# Patient Record
Sex: Female | Born: 1955 | Race: White | Hispanic: No | Marital: Married | Smoking: Former smoker
Health system: Southern US, Community
[De-identification: ages and names within clinical notes are randomized; demographics above are authoritative.]

## PROBLEM LIST (undated history)

## (undated) DIAGNOSIS — W3400XA Accidental discharge from unspecified firearms or gun, initial encounter: Secondary | ICD-10-CM

## (undated) DIAGNOSIS — F419 Anxiety disorder, unspecified: Secondary | ICD-10-CM

## (undated) DIAGNOSIS — S72001A Fracture of unspecified part of neck of right femur, initial encounter for closed fracture: Secondary | ICD-10-CM

## (undated) DIAGNOSIS — F329 Major depressive disorder, single episode, unspecified: Secondary | ICD-10-CM

## (undated) DIAGNOSIS — J45909 Unspecified asthma, uncomplicated: Secondary | ICD-10-CM

## (undated) DIAGNOSIS — J449 Chronic obstructive pulmonary disease, unspecified: Secondary | ICD-10-CM

## (undated) DIAGNOSIS — F32A Depression, unspecified: Secondary | ICD-10-CM

## (undated) HISTORY — PX: ABDOMINAL HYSTERECTOMY: SHX81

---

## 2005-05-28 ENCOUNTER — Emergency Department (HOSPITAL_COMMUNITY): Admission: EM | Admit: 2005-05-28 | Discharge: 2005-05-28 | Payer: Self-pay | Admitting: Emergency Medicine

## 2005-10-03 ENCOUNTER — Emergency Department (HOSPITAL_COMMUNITY): Admission: EM | Admit: 2005-10-03 | Discharge: 2005-10-03 | Payer: Self-pay | Admitting: Emergency Medicine

## 2005-10-20 ENCOUNTER — Encounter (HOSPITAL_COMMUNITY): Admission: RE | Admit: 2005-10-20 | Discharge: 2005-11-19 | Payer: Self-pay

## 2005-10-24 ENCOUNTER — Ambulatory Visit: Payer: Self-pay | Admitting: Family Medicine

## 2005-11-07 ENCOUNTER — Ambulatory Visit: Payer: Self-pay | Admitting: Family Medicine

## 2005-11-08 ENCOUNTER — Encounter (INDEPENDENT_AMBULATORY_CARE_PROVIDER_SITE_OTHER): Payer: Self-pay | Admitting: Family Medicine

## 2005-11-16 ENCOUNTER — Ambulatory Visit: Payer: Self-pay | Admitting: Internal Medicine

## 2005-11-16 ENCOUNTER — Ambulatory Visit (HOSPITAL_COMMUNITY): Admission: RE | Admit: 2005-11-16 | Discharge: 2005-11-16 | Payer: Self-pay | Admitting: Internal Medicine

## 2005-11-18 ENCOUNTER — Ambulatory Visit: Payer: Self-pay | Admitting: Family Medicine

## 2005-11-18 ENCOUNTER — Ambulatory Visit (HOSPITAL_COMMUNITY): Admission: RE | Admit: 2005-11-18 | Discharge: 2005-11-18 | Payer: Self-pay | Admitting: Family Medicine

## 2005-11-21 ENCOUNTER — Ambulatory Visit: Payer: Self-pay | Admitting: Family Medicine

## 2005-11-25 ENCOUNTER — Ambulatory Visit: Payer: Self-pay | Admitting: Family Medicine

## 2005-12-02 ENCOUNTER — Ambulatory Visit: Payer: Self-pay | Admitting: Family Medicine

## 2005-12-02 LAB — CONVERTED CEMR LAB
RBC count: 4.57 10*6/uL
WBC, blood: 15.6 10*3/uL

## 2005-12-05 ENCOUNTER — Ambulatory Visit: Payer: Self-pay | Admitting: Family Medicine

## 2005-12-07 ENCOUNTER — Ambulatory Visit: Payer: Self-pay | Admitting: Family Medicine

## 2006-01-05 ENCOUNTER — Ambulatory Visit: Payer: Self-pay | Admitting: Family Medicine

## 2006-01-30 ENCOUNTER — Ambulatory Visit: Payer: Self-pay | Admitting: Family Medicine

## 2006-02-02 ENCOUNTER — Ambulatory Visit: Payer: Self-pay | Admitting: Family Medicine

## 2006-02-03 ENCOUNTER — Encounter (INDEPENDENT_AMBULATORY_CARE_PROVIDER_SITE_OTHER): Payer: Self-pay | Admitting: Family Medicine

## 2006-02-03 ENCOUNTER — Ambulatory Visit (HOSPITAL_COMMUNITY): Admission: RE | Admit: 2006-02-03 | Discharge: 2006-02-03 | Payer: Self-pay | Admitting: Family Medicine

## 2006-02-08 ENCOUNTER — Ambulatory Visit (HOSPITAL_COMMUNITY): Admission: RE | Admit: 2006-02-08 | Discharge: 2006-02-08 | Payer: Self-pay | Admitting: Family Medicine

## 2006-02-16 ENCOUNTER — Ambulatory Visit: Payer: Self-pay | Admitting: Family Medicine

## 2006-03-07 ENCOUNTER — Ambulatory Visit: Payer: Self-pay | Admitting: Family Medicine

## 2006-03-09 ENCOUNTER — Encounter (INDEPENDENT_AMBULATORY_CARE_PROVIDER_SITE_OTHER): Payer: Self-pay | Admitting: Family Medicine

## 2006-03-09 ENCOUNTER — Ambulatory Visit (HOSPITAL_COMMUNITY): Admission: RE | Admit: 2006-03-09 | Discharge: 2006-03-09 | Payer: Self-pay | Admitting: Family Medicine

## 2006-03-16 ENCOUNTER — Encounter (INDEPENDENT_AMBULATORY_CARE_PROVIDER_SITE_OTHER): Payer: Self-pay | Admitting: Family Medicine

## 2006-03-16 DIAGNOSIS — K219 Gastro-esophageal reflux disease without esophagitis: Secondary | ICD-10-CM | POA: Insufficient documentation

## 2006-03-16 DIAGNOSIS — M533 Sacrococcygeal disorders, not elsewhere classified: Secondary | ICD-10-CM | POA: Insufficient documentation

## 2006-03-16 DIAGNOSIS — R32 Unspecified urinary incontinence: Secondary | ICD-10-CM | POA: Insufficient documentation

## 2006-03-16 DIAGNOSIS — J45909 Unspecified asthma, uncomplicated: Secondary | ICD-10-CM | POA: Insufficient documentation

## 2006-03-16 DIAGNOSIS — Z8639 Personal history of other endocrine, nutritional and metabolic disease: Secondary | ICD-10-CM | POA: Insufficient documentation

## 2006-03-16 DIAGNOSIS — G609 Hereditary and idiopathic neuropathy, unspecified: Secondary | ICD-10-CM | POA: Insufficient documentation

## 2006-03-16 DIAGNOSIS — J309 Allergic rhinitis, unspecified: Secondary | ICD-10-CM | POA: Insufficient documentation

## 2006-03-16 DIAGNOSIS — G5600 Carpal tunnel syndrome, unspecified upper limb: Secondary | ICD-10-CM | POA: Insufficient documentation

## 2006-03-16 DIAGNOSIS — Z862 Personal history of diseases of the blood and blood-forming organs and certain disorders involving the immune mechanism: Secondary | ICD-10-CM | POA: Insufficient documentation

## 2006-03-16 DIAGNOSIS — G43909 Migraine, unspecified, not intractable, without status migrainosus: Secondary | ICD-10-CM | POA: Insufficient documentation

## 2006-04-07 ENCOUNTER — Ambulatory Visit (HOSPITAL_COMMUNITY): Admission: RE | Admit: 2006-04-07 | Discharge: 2006-04-07 | Payer: Self-pay | Admitting: Internal Medicine

## 2006-04-07 ENCOUNTER — Ambulatory Visit: Payer: Self-pay | Admitting: Internal Medicine

## 2006-05-11 ENCOUNTER — Ambulatory Visit: Payer: Self-pay | Admitting: Internal Medicine

## 2006-05-11 ENCOUNTER — Encounter (INDEPENDENT_AMBULATORY_CARE_PROVIDER_SITE_OTHER): Payer: Self-pay | Admitting: Family Medicine

## 2006-05-24 ENCOUNTER — Ambulatory Visit: Payer: Self-pay | Admitting: Family Medicine

## 2006-06-07 ENCOUNTER — Ambulatory Visit: Payer: Self-pay | Admitting: Family Medicine

## 2006-07-05 ENCOUNTER — Ambulatory Visit: Payer: Self-pay | Admitting: Family Medicine

## 2006-07-05 LAB — CONVERTED CEMR LAB
Bilirubin Urine: NEGATIVE
Protein, U semiquant: NEGATIVE
Specific Gravity, Urine: 1.02
WBC Urine, dipstick: NEGATIVE
pH: 6

## 2006-08-02 ENCOUNTER — Ambulatory Visit: Payer: Self-pay | Admitting: Family Medicine

## 2006-08-18 ENCOUNTER — Encounter (INDEPENDENT_AMBULATORY_CARE_PROVIDER_SITE_OTHER): Payer: Self-pay | Admitting: Family Medicine

## 2006-09-01 ENCOUNTER — Ambulatory Visit: Payer: Self-pay | Admitting: Family Medicine

## 2006-09-01 ENCOUNTER — Ambulatory Visit (HOSPITAL_COMMUNITY): Admission: RE | Admit: 2006-09-01 | Discharge: 2006-09-01 | Payer: Self-pay | Admitting: Family Medicine

## 2006-09-06 ENCOUNTER — Ambulatory Visit (HOSPITAL_COMMUNITY): Admission: RE | Admit: 2006-09-06 | Discharge: 2006-09-06 | Payer: Self-pay | Admitting: Family Medicine

## 2006-09-08 ENCOUNTER — Ambulatory Visit: Payer: Self-pay | Admitting: Internal Medicine

## 2006-09-13 ENCOUNTER — Encounter (INDEPENDENT_AMBULATORY_CARE_PROVIDER_SITE_OTHER): Payer: Self-pay | Admitting: Family Medicine

## 2006-09-15 ENCOUNTER — Ambulatory Visit: Payer: Self-pay | Admitting: Family Medicine

## 2006-09-15 LAB — CONVERTED CEMR LAB
Bilirubin Urine: NEGATIVE
Protein, U semiquant: NEGATIVE
Specific Gravity, Urine: >=1.03
WBC Urine, dipstick: NEGATIVE

## 2006-10-09 ENCOUNTER — Encounter (INDEPENDENT_AMBULATORY_CARE_PROVIDER_SITE_OTHER): Payer: Self-pay | Admitting: Family Medicine

## 2006-10-17 ENCOUNTER — Encounter (INDEPENDENT_AMBULATORY_CARE_PROVIDER_SITE_OTHER): Payer: Self-pay | Admitting: Family Medicine

## 2006-11-02 ENCOUNTER — Encounter (INDEPENDENT_AMBULATORY_CARE_PROVIDER_SITE_OTHER): Payer: Self-pay | Admitting: Family Medicine

## 2006-11-07 ENCOUNTER — Ambulatory Visit: Payer: Self-pay | Admitting: Family Medicine

## 2006-11-07 LAB — CONVERTED CEMR LAB
ALT: 32 units/L (ref 0–35)
AST: 39 units/L — ABNORMAL HIGH (ref 0–37)
Albumin: 4.3 g/dL (ref 3.5–5.2)
Alkaline Phosphatase: 57 units/L (ref 39–117)
Basophils Absolute: 0 10*3/uL (ref 0.0–0.1)
Basophils Relative: 0 % (ref 0–1)
Eosinophils Absolute: 0 10*3/uL (ref 0.0–0.7)
Eosinophils Relative: 1 % (ref 0–5)
Hemoglobin: 14.8 g/dL (ref 12.0–15.0)
MCHC: 35.3 g/dL (ref 30.0–36.0)
MCV: 92.3 fL (ref 78.0–100.0)
Monocytes Absolute: 0.4 10*3/uL (ref 0.2–0.7)
Monocytes Relative: 6 % (ref 3–11)
Neutro Abs: 4.8 10*3/uL (ref 1.7–7.7)
RDW: 12.2 % (ref 11.5–14.0)
Total Bilirubin: 1 mg/dL (ref 0.3–1.2)
Total Protein: 7.3 g/dL (ref 6.0–8.3)

## 2006-11-08 ENCOUNTER — Encounter (INDEPENDENT_AMBULATORY_CARE_PROVIDER_SITE_OTHER): Payer: Self-pay | Admitting: Family Medicine

## 2006-12-11 ENCOUNTER — Encounter (INDEPENDENT_AMBULATORY_CARE_PROVIDER_SITE_OTHER): Payer: Self-pay | Admitting: Family Medicine

## 2006-12-12 ENCOUNTER — Ambulatory Visit: Payer: Self-pay | Admitting: Family Medicine

## 2006-12-12 LAB — CONVERTED CEMR LAB
Bilirubin Urine: NEGATIVE
Nitrite: NEGATIVE
Specific Gravity, Urine: 1.01
Urobilinogen, UA: 0.2
WBC Urine, dipstick: NEGATIVE
pH: 7

## 2006-12-13 ENCOUNTER — Telehealth (INDEPENDENT_AMBULATORY_CARE_PROVIDER_SITE_OTHER): Payer: Self-pay | Admitting: *Deleted

## 2006-12-13 LAB — CONVERTED CEMR LAB
ALT: 30 units/L (ref 0–35)
AST: 34 units/L (ref 0–37)
Amylase: 133 units/L — ABNORMAL HIGH (ref 0–105)
Basophils Absolute: 0.1 10*3/uL (ref 0.0–0.1)
Basophils Relative: 1 % (ref 0–1)
CO2: 25 meq/L (ref 19–32)
Chloride: 104 meq/L (ref 96–112)
Creatinine, Ser: 1.13 mg/dL (ref 0.40–1.20)
Lymphocytes Relative: 34 % (ref 12–46)
MCHC: 33 g/dL (ref 30.0–36.0)
Monocytes Absolute: 0.5 10*3/uL (ref 0.2–0.7)
Neutro Abs: 2.9 10*3/uL (ref 1.7–7.7)
Platelets: 205 10*3/uL (ref 150–400)
RDW: 12.5 % (ref 11.5–14.0)
Sodium: 143 meq/L (ref 135–145)
Total Bilirubin: 0.4 mg/dL (ref 0.3–1.2)
Total Protein: 6.8 g/dL (ref 6.0–8.3)

## 2006-12-14 ENCOUNTER — Other Ambulatory Visit: Admission: RE | Admit: 2006-12-14 | Discharge: 2006-12-14 | Payer: Self-pay | Admitting: Family Medicine

## 2006-12-14 ENCOUNTER — Ambulatory Visit: Payer: Self-pay | Admitting: Family Medicine

## 2006-12-14 ENCOUNTER — Encounter (INDEPENDENT_AMBULATORY_CARE_PROVIDER_SITE_OTHER): Payer: Self-pay | Admitting: Family Medicine

## 2006-12-14 LAB — CONVERTED CEMR LAB: Pap Smear: NORMAL

## 2006-12-15 ENCOUNTER — Telehealth (INDEPENDENT_AMBULATORY_CARE_PROVIDER_SITE_OTHER): Payer: Self-pay | Admitting: *Deleted

## 2006-12-15 ENCOUNTER — Encounter (INDEPENDENT_AMBULATORY_CARE_PROVIDER_SITE_OTHER): Payer: Self-pay | Admitting: Family Medicine

## 2006-12-15 LAB — CONVERTED CEMR LAB
Gardnerella vaginalis: NEGATIVE
Trichomonal Vaginitis: NEGATIVE

## 2006-12-25 ENCOUNTER — Encounter (INDEPENDENT_AMBULATORY_CARE_PROVIDER_SITE_OTHER): Payer: Self-pay | Admitting: Family Medicine

## 2007-01-08 ENCOUNTER — Encounter (INDEPENDENT_AMBULATORY_CARE_PROVIDER_SITE_OTHER): Payer: Self-pay | Admitting: Family Medicine

## 2007-01-25 ENCOUNTER — Ambulatory Visit: Payer: Self-pay | Admitting: Family Medicine

## 2007-01-25 ENCOUNTER — Telehealth (INDEPENDENT_AMBULATORY_CARE_PROVIDER_SITE_OTHER): Payer: Self-pay | Admitting: Family Medicine

## 2007-01-30 ENCOUNTER — Encounter (INDEPENDENT_AMBULATORY_CARE_PROVIDER_SITE_OTHER): Payer: Self-pay | Admitting: Family Medicine

## 2007-02-02 ENCOUNTER — Encounter (INDEPENDENT_AMBULATORY_CARE_PROVIDER_SITE_OTHER): Payer: Self-pay | Admitting: Family Medicine

## 2007-02-06 ENCOUNTER — Encounter (INDEPENDENT_AMBULATORY_CARE_PROVIDER_SITE_OTHER): Payer: Self-pay | Admitting: Family Medicine

## 2007-02-27 ENCOUNTER — Ambulatory Visit: Payer: Self-pay | Admitting: Family Medicine

## 2007-03-01 ENCOUNTER — Encounter (INDEPENDENT_AMBULATORY_CARE_PROVIDER_SITE_OTHER): Payer: Self-pay | Admitting: Family Medicine

## 2007-04-03 ENCOUNTER — Encounter (INDEPENDENT_AMBULATORY_CARE_PROVIDER_SITE_OTHER): Payer: Self-pay | Admitting: Family Medicine

## 2007-04-10 ENCOUNTER — Ambulatory Visit: Payer: Self-pay | Admitting: Family Medicine

## 2007-04-10 ENCOUNTER — Telehealth (INDEPENDENT_AMBULATORY_CARE_PROVIDER_SITE_OTHER): Payer: Self-pay | Admitting: *Deleted

## 2007-04-12 ENCOUNTER — Ambulatory Visit (HOSPITAL_COMMUNITY): Admission: RE | Admit: 2007-04-12 | Discharge: 2007-04-12 | Payer: Self-pay | Admitting: Family Medicine

## 2007-04-25 ENCOUNTER — Ambulatory Visit (HOSPITAL_COMMUNITY): Admission: RE | Admit: 2007-04-25 | Discharge: 2007-04-25 | Payer: Self-pay | Admitting: Family Medicine

## 2007-04-25 ENCOUNTER — Ambulatory Visit: Payer: Self-pay | Admitting: Family Medicine

## 2007-05-02 ENCOUNTER — Encounter (INDEPENDENT_AMBULATORY_CARE_PROVIDER_SITE_OTHER): Payer: Self-pay | Admitting: Family Medicine

## 2007-05-02 ENCOUNTER — Encounter: Payer: Self-pay | Admitting: Orthopedic Surgery

## 2007-05-02 ENCOUNTER — Emergency Department (HOSPITAL_COMMUNITY): Admission: EM | Admit: 2007-05-02 | Discharge: 2007-05-03 | Payer: Self-pay | Admitting: Emergency Medicine

## 2007-05-02 ENCOUNTER — Ambulatory Visit (HOSPITAL_COMMUNITY): Admission: RE | Admit: 2007-05-02 | Discharge: 2007-05-02 | Payer: Self-pay | Admitting: Family Medicine

## 2007-05-02 ENCOUNTER — Telehealth (INDEPENDENT_AMBULATORY_CARE_PROVIDER_SITE_OTHER): Payer: Self-pay | Admitting: *Deleted

## 2007-05-03 ENCOUNTER — Ambulatory Visit (HOSPITAL_COMMUNITY): Admission: RE | Admit: 2007-05-03 | Discharge: 2007-05-03 | Payer: Self-pay | Admitting: Family Medicine

## 2007-05-03 ENCOUNTER — Ambulatory Visit: Payer: Self-pay | Admitting: Family Medicine

## 2007-05-03 ENCOUNTER — Telehealth (INDEPENDENT_AMBULATORY_CARE_PROVIDER_SITE_OTHER): Payer: Self-pay | Admitting: Family Medicine

## 2007-05-04 ENCOUNTER — Encounter (INDEPENDENT_AMBULATORY_CARE_PROVIDER_SITE_OTHER): Payer: Self-pay | Admitting: Family Medicine

## 2007-05-04 ENCOUNTER — Telehealth (INDEPENDENT_AMBULATORY_CARE_PROVIDER_SITE_OTHER): Payer: Self-pay | Admitting: *Deleted

## 2007-05-07 ENCOUNTER — Ambulatory Visit: Payer: Self-pay | Admitting: Family Medicine

## 2007-06-06 ENCOUNTER — Encounter (INDEPENDENT_AMBULATORY_CARE_PROVIDER_SITE_OTHER): Payer: Self-pay | Admitting: Family Medicine

## 2007-06-07 ENCOUNTER — Ambulatory Visit: Payer: Self-pay | Admitting: Family Medicine

## 2007-06-07 ENCOUNTER — Telehealth (INDEPENDENT_AMBULATORY_CARE_PROVIDER_SITE_OTHER): Payer: Self-pay | Admitting: *Deleted

## 2007-06-07 DIAGNOSIS — E785 Hyperlipidemia, unspecified: Secondary | ICD-10-CM | POA: Insufficient documentation

## 2007-06-14 ENCOUNTER — Telehealth (INDEPENDENT_AMBULATORY_CARE_PROVIDER_SITE_OTHER): Payer: Self-pay | Admitting: *Deleted

## 2007-07-02 ENCOUNTER — Encounter (INDEPENDENT_AMBULATORY_CARE_PROVIDER_SITE_OTHER): Payer: Self-pay | Admitting: Family Medicine

## 2007-07-03 ENCOUNTER — Telehealth (INDEPENDENT_AMBULATORY_CARE_PROVIDER_SITE_OTHER): Payer: Self-pay | Admitting: Family Medicine

## 2007-07-03 LAB — CONVERTED CEMR LAB
Alkaline Phosphatase: 43 units/L (ref 39–117)
Creatinine, Ser: 0.88 mg/dL (ref 0.40–1.20)
Eosinophils Absolute: 0.1 10*3/uL (ref 0.0–0.7)
Eosinophils Relative: 2 % (ref 0–5)
Glucose, Bld: 95 mg/dL (ref 70–99)
HCT: 41.2 % (ref 36.0–46.0)
HDL: 75 mg/dL (ref >39)
LDL Cholesterol: 135 mg/dL — ABNORMAL HIGH (ref 0–99)
Lymphs Abs: 1.4 10*3/uL (ref 0.7–4.0)
MCV: 98.8 fL (ref 78.0–100.0)
Platelets: 224 10*3/uL (ref 150–400)
RDW: 13.1 % (ref 11.5–15.5)
Sodium: 142 meq/L (ref 135–145)
Total Bilirubin: 0.6 mg/dL (ref 0.3–1.2)
Total CHOL/HDL Ratio: 3
Total Protein: 6.9 g/dL (ref 6.0–8.3)
Triglycerides: 67 mg/dL (ref <150)
VLDL: 13 mg/dL (ref 0–40)
WBC: 6.2 10*3/uL (ref 4.0–10.5)

## 2007-07-04 ENCOUNTER — Encounter (INDEPENDENT_AMBULATORY_CARE_PROVIDER_SITE_OTHER): Payer: Self-pay | Admitting: Family Medicine

## 2007-07-05 ENCOUNTER — Ambulatory Visit: Payer: Self-pay | Admitting: Family Medicine

## 2007-07-05 ENCOUNTER — Telehealth (INDEPENDENT_AMBULATORY_CARE_PROVIDER_SITE_OTHER): Payer: Self-pay | Admitting: *Deleted

## 2007-07-16 ENCOUNTER — Encounter (HOSPITAL_COMMUNITY): Admission: RE | Admit: 2007-07-16 | Discharge: 2007-08-15 | Payer: Self-pay | Admitting: Family Medicine

## 2007-07-16 ENCOUNTER — Encounter (INDEPENDENT_AMBULATORY_CARE_PROVIDER_SITE_OTHER): Payer: Self-pay | Admitting: Family Medicine

## 2007-07-30 ENCOUNTER — Ambulatory Visit: Payer: Self-pay | Admitting: Orthopedic Surgery

## 2007-07-31 ENCOUNTER — Encounter (INDEPENDENT_AMBULATORY_CARE_PROVIDER_SITE_OTHER): Payer: Self-pay | Admitting: Family Medicine

## 2007-08-01 ENCOUNTER — Encounter (INDEPENDENT_AMBULATORY_CARE_PROVIDER_SITE_OTHER): Payer: Self-pay | Admitting: Family Medicine

## 2007-08-30 ENCOUNTER — Encounter (INDEPENDENT_AMBULATORY_CARE_PROVIDER_SITE_OTHER): Payer: Self-pay | Admitting: Family Medicine

## 2007-09-27 ENCOUNTER — Encounter (INDEPENDENT_AMBULATORY_CARE_PROVIDER_SITE_OTHER): Payer: Self-pay | Admitting: Family Medicine

## 2007-10-04 ENCOUNTER — Encounter (INDEPENDENT_AMBULATORY_CARE_PROVIDER_SITE_OTHER): Payer: Self-pay | Admitting: Family Medicine

## 2007-10-08 ENCOUNTER — Ambulatory Visit: Payer: Self-pay | Admitting: Family Medicine

## 2007-10-30 ENCOUNTER — Encounter (INDEPENDENT_AMBULATORY_CARE_PROVIDER_SITE_OTHER): Payer: Self-pay | Admitting: Family Medicine

## 2007-11-01 ENCOUNTER — Encounter (INDEPENDENT_AMBULATORY_CARE_PROVIDER_SITE_OTHER): Payer: Self-pay | Admitting: Family Medicine

## 2007-11-05 ENCOUNTER — Encounter (INDEPENDENT_AMBULATORY_CARE_PROVIDER_SITE_OTHER): Payer: Self-pay | Admitting: Family Medicine

## 2007-11-05 ENCOUNTER — Encounter: Payer: Self-pay | Admitting: Family Medicine

## 2007-11-05 ENCOUNTER — Ambulatory Visit: Payer: Self-pay | Admitting: Family Medicine

## 2007-11-12 ENCOUNTER — Ambulatory Visit (HOSPITAL_COMMUNITY): Admission: RE | Admit: 2007-11-12 | Discharge: 2007-11-12 | Payer: Self-pay | Admitting: Family Medicine

## 2007-11-15 ENCOUNTER — Telehealth (INDEPENDENT_AMBULATORY_CARE_PROVIDER_SITE_OTHER): Payer: Self-pay | Admitting: *Deleted

## 2007-11-19 ENCOUNTER — Ambulatory Visit (HOSPITAL_COMMUNITY): Admission: RE | Admit: 2007-11-19 | Discharge: 2007-11-19 | Payer: Self-pay | Admitting: Family Medicine

## 2007-11-22 ENCOUNTER — Telehealth (INDEPENDENT_AMBULATORY_CARE_PROVIDER_SITE_OTHER): Payer: Self-pay | Admitting: *Deleted

## 2007-11-27 ENCOUNTER — Ambulatory Visit: Payer: Self-pay | Admitting: Family Medicine

## 2007-11-28 LAB — CONVERTED CEMR LAB
ALT: 17 units/L (ref 0–35)
AST: 21 units/L (ref 0–37)
Albumin: 4.1 g/dL (ref 3.5–5.2)
Alkaline Phosphatase: 49 units/L (ref 39–117)
Basophils Absolute: 0.1 10*3/uL (ref 0.0–0.1)
Basophils Relative: 2 % — ABNORMAL HIGH (ref 0–1)
Calcium: 9.4 mg/dL (ref 8.4–10.5)
Chloride: 105 meq/L (ref 96–112)
MCHC: 32.7 g/dL (ref 30.0–36.0)
Neutro Abs: 2.8 10*3/uL (ref 1.7–7.7)
Neutrophils Relative %: 54 % (ref 43–77)
Platelets: 317 10*3/uL (ref 150–400)
Potassium: 4.7 meq/L (ref 3.5–5.3)
RBC: 3.97 M/uL (ref 3.87–5.11)
RDW: 12.8 % (ref 11.5–15.5)

## 2007-11-29 ENCOUNTER — Ambulatory Visit: Payer: Self-pay | Admitting: Family Medicine

## 2007-11-29 ENCOUNTER — Ambulatory Visit (HOSPITAL_COMMUNITY): Admission: RE | Admit: 2007-11-29 | Discharge: 2007-11-29 | Payer: Self-pay | Admitting: Family Medicine

## 2007-11-29 LAB — CONVERTED CEMR LAB: Lipase: 80 units/L — ABNORMAL HIGH (ref 11–59)

## 2007-12-04 ENCOUNTER — Ambulatory Visit: Payer: Self-pay | Admitting: Family Medicine

## 2007-12-07 ENCOUNTER — Encounter (INDEPENDENT_AMBULATORY_CARE_PROVIDER_SITE_OTHER): Payer: Self-pay | Admitting: Family Medicine

## 2007-12-10 ENCOUNTER — Ambulatory Visit: Payer: Self-pay | Admitting: Orthopedic Surgery

## 2007-12-13 ENCOUNTER — Ambulatory Visit (HOSPITAL_COMMUNITY): Admission: RE | Admit: 2007-12-13 | Discharge: 2007-12-13 | Payer: Self-pay | Admitting: Orthopedic Surgery

## 2007-12-19 ENCOUNTER — Ambulatory Visit: Payer: Self-pay | Admitting: Orthopedic Surgery

## 2007-12-27 ENCOUNTER — Ambulatory Visit: Payer: Self-pay | Admitting: Internal Medicine

## 2007-12-27 ENCOUNTER — Encounter (INDEPENDENT_AMBULATORY_CARE_PROVIDER_SITE_OTHER): Payer: Self-pay | Admitting: Family Medicine

## 2007-12-28 ENCOUNTER — Ambulatory Visit (HOSPITAL_COMMUNITY): Admission: RE | Admit: 2007-12-28 | Discharge: 2007-12-28 | Payer: Self-pay | Admitting: Internal Medicine

## 2007-12-28 ENCOUNTER — Encounter: Payer: Self-pay | Admitting: Gastroenterology

## 2008-01-02 ENCOUNTER — Encounter: Payer: Self-pay | Admitting: Orthopedic Surgery

## 2008-01-04 ENCOUNTER — Encounter: Payer: Self-pay | Admitting: Orthopedic Surgery

## 2008-01-04 ENCOUNTER — Ambulatory Visit: Payer: Self-pay | Admitting: Orthopedic Surgery

## 2008-01-04 ENCOUNTER — Ambulatory Visit (HOSPITAL_COMMUNITY): Admission: RE | Admit: 2008-01-04 | Discharge: 2008-01-04 | Payer: Self-pay | Admitting: Orthopedic Surgery

## 2008-01-08 ENCOUNTER — Ambulatory Visit: Payer: Self-pay | Admitting: Orthopedic Surgery

## 2008-01-14 ENCOUNTER — Ambulatory Visit: Payer: Self-pay | Admitting: Orthopedic Surgery

## 2008-01-14 ENCOUNTER — Telehealth: Payer: Self-pay | Admitting: Orthopedic Surgery

## 2008-01-16 ENCOUNTER — Ambulatory Visit: Payer: Self-pay | Admitting: Orthopedic Surgery

## 2008-01-16 ENCOUNTER — Encounter (HOSPITAL_COMMUNITY): Admission: RE | Admit: 2008-01-16 | Discharge: 2008-02-15 | Payer: Self-pay | Admitting: Orthopedic Surgery

## 2008-01-21 ENCOUNTER — Encounter: Payer: Self-pay | Admitting: Orthopedic Surgery

## 2008-01-29 ENCOUNTER — Telehealth: Payer: Self-pay | Admitting: Orthopedic Surgery

## 2008-01-30 ENCOUNTER — Telehealth: Payer: Self-pay | Admitting: Orthopedic Surgery

## 2008-02-11 ENCOUNTER — Encounter (INDEPENDENT_AMBULATORY_CARE_PROVIDER_SITE_OTHER): Payer: Self-pay | Admitting: Family Medicine

## 2008-02-11 ENCOUNTER — Encounter: Payer: Self-pay | Admitting: Gastroenterology

## 2008-02-11 DIAGNOSIS — Z8719 Personal history of other diseases of the digestive system: Secondary | ICD-10-CM | POA: Insufficient documentation

## 2008-02-14 ENCOUNTER — Encounter: Payer: Self-pay | Admitting: Orthopedic Surgery

## 2008-02-18 ENCOUNTER — Ambulatory Visit: Payer: Self-pay | Admitting: Orthopedic Surgery

## 2008-02-18 ENCOUNTER — Ambulatory Visit: Payer: Self-pay | Admitting: Family Medicine

## 2008-02-21 ENCOUNTER — Encounter (HOSPITAL_COMMUNITY): Admission: RE | Admit: 2008-02-21 | Discharge: 2008-03-22 | Payer: Self-pay | Admitting: Orthopedic Surgery

## 2008-02-28 ENCOUNTER — Encounter (INDEPENDENT_AMBULATORY_CARE_PROVIDER_SITE_OTHER): Payer: Self-pay | Admitting: Family Medicine

## 2008-02-28 ENCOUNTER — Ambulatory Visit (HOSPITAL_COMMUNITY): Admission: RE | Admit: 2008-02-28 | Discharge: 2008-02-28 | Payer: Self-pay | Admitting: Gastroenterology

## 2008-02-28 ENCOUNTER — Ambulatory Visit: Payer: Self-pay | Admitting: Gastroenterology

## 2008-03-05 ENCOUNTER — Ambulatory Visit: Payer: Self-pay | Admitting: Family Medicine

## 2008-03-21 ENCOUNTER — Telehealth (INDEPENDENT_AMBULATORY_CARE_PROVIDER_SITE_OTHER): Payer: Self-pay | Admitting: *Deleted

## 2008-03-26 ENCOUNTER — Encounter (INDEPENDENT_AMBULATORY_CARE_PROVIDER_SITE_OTHER): Payer: Self-pay | Admitting: Family Medicine

## 2008-04-02 ENCOUNTER — Ambulatory Visit: Payer: Self-pay | Admitting: Family Medicine

## 2008-04-02 ENCOUNTER — Telehealth (INDEPENDENT_AMBULATORY_CARE_PROVIDER_SITE_OTHER): Payer: Self-pay | Admitting: *Deleted

## 2008-04-03 ENCOUNTER — Encounter (INDEPENDENT_AMBULATORY_CARE_PROVIDER_SITE_OTHER): Payer: Self-pay | Admitting: Family Medicine

## 2008-04-03 LAB — CONVERTED CEMR LAB
BUN: 15 mg/dL (ref 6–23)
Basophils Absolute: 0 10*3/uL (ref 0.0–0.1)
CO2: 20 meq/L (ref 19–32)
Calcium: 9.5 mg/dL (ref 8.4–10.5)
Chloride: 102 meq/L (ref 96–112)
Creatinine, Ser: 0.94 mg/dL (ref 0.40–1.20)
Eosinophils Relative: 2 % (ref 0–5)
HCT: 40.9 % (ref 36.0–46.0)
Hemoglobin: 13.9 g/dL (ref 12.0–15.0)
Lymphocytes Relative: 25 % (ref 12–46)
MCHC: 34 g/dL (ref 30.0–36.0)
Monocytes Absolute: 0.6 10*3/uL (ref 0.1–1.0)
Monocytes Relative: 10 % (ref 3–12)
RBC: 4.42 M/uL (ref 3.87–5.11)
RDW: 12.7 % (ref 11.5–15.5)

## 2008-04-04 ENCOUNTER — Encounter (INDEPENDENT_AMBULATORY_CARE_PROVIDER_SITE_OTHER): Payer: Self-pay | Admitting: Family Medicine

## 2008-04-09 ENCOUNTER — Ambulatory Visit (HOSPITAL_COMMUNITY): Admission: RE | Admit: 2008-04-09 | Discharge: 2008-04-09 | Payer: Self-pay | Admitting: Psychiatry

## 2008-04-16 ENCOUNTER — Ambulatory Visit: Payer: Self-pay | Admitting: Family Medicine

## 2008-04-30 ENCOUNTER — Encounter (INDEPENDENT_AMBULATORY_CARE_PROVIDER_SITE_OTHER): Payer: Self-pay | Admitting: Family Medicine

## 2008-06-04 ENCOUNTER — Encounter: Payer: Self-pay | Admitting: Orthopedic Surgery

## 2008-06-18 ENCOUNTER — Ambulatory Visit: Payer: Self-pay | Admitting: Family Medicine

## 2008-06-19 ENCOUNTER — Encounter (INDEPENDENT_AMBULATORY_CARE_PROVIDER_SITE_OTHER): Payer: Self-pay | Admitting: Family Medicine

## 2008-06-19 LAB — CONVERTED CEMR LAB
Barbiturate Quant, Ur: NEGATIVE
Creatinine,U: 35.7 mg/dL
Marijuana Metabolite: NEGATIVE
Methadone: POSITIVE — AB
Opiates: NEGATIVE
Phencyclidine (PCP): NEGATIVE
Propoxyphene: NEGATIVE

## 2008-07-02 ENCOUNTER — Encounter (INDEPENDENT_AMBULATORY_CARE_PROVIDER_SITE_OTHER): Payer: Self-pay | Admitting: Family Medicine

## 2008-07-21 ENCOUNTER — Ambulatory Visit: Payer: Self-pay | Admitting: Family Medicine

## 2008-07-21 DIAGNOSIS — F341 Dysthymic disorder: Secondary | ICD-10-CM | POA: Insufficient documentation

## 2008-07-22 ENCOUNTER — Telehealth (INDEPENDENT_AMBULATORY_CARE_PROVIDER_SITE_OTHER): Payer: Self-pay | Admitting: Family Medicine

## 2008-07-30 ENCOUNTER — Encounter (INDEPENDENT_AMBULATORY_CARE_PROVIDER_SITE_OTHER): Payer: Self-pay | Admitting: Family Medicine

## 2008-08-20 ENCOUNTER — Ambulatory Visit (HOSPITAL_COMMUNITY): Admission: RE | Admit: 2008-08-20 | Discharge: 2008-08-20 | Payer: Self-pay | Admitting: Family Medicine

## 2008-08-20 ENCOUNTER — Ambulatory Visit: Payer: Self-pay | Admitting: Family Medicine

## 2008-08-22 ENCOUNTER — Encounter (INDEPENDENT_AMBULATORY_CARE_PROVIDER_SITE_OTHER): Payer: Self-pay | Admitting: Family Medicine

## 2008-08-25 LAB — CONVERTED CEMR LAB
AST: 26 units/L (ref 0–37)
Albumin: 4.2 g/dL (ref 3.5–5.2)
Alkaline Phosphatase: 48 units/L (ref 39–117)
Basophils Absolute: 0 10*3/uL (ref 0.0–0.1)
Basophils Relative: 0 % (ref 0–1)
Glucose, Bld: 91 mg/dL (ref 70–99)
HDL: 52 mg/dL (ref >39)
Hemoglobin: 14.3 g/dL (ref 12.0–15.0)
LDL Cholesterol: 120 mg/dL — ABNORMAL HIGH (ref 0–99)
Lymphocytes Relative: 28 % (ref 12–46)
MCHC: 34.7 g/dL (ref 30.0–36.0)
Monocytes Absolute: 0.5 10*3/uL (ref 0.1–1.0)
Neutro Abs: 3 10*3/uL (ref 1.7–7.7)
Neutrophils Relative %: 60 % (ref 43–77)
Platelets: 198 10*3/uL (ref 150–400)
Potassium: 4.4 meq/L (ref 3.5–5.3)
RDW: 13.2 % (ref 11.5–15.5)
Sodium: 142 meq/L (ref 135–145)
TSH: 1.624 microintl units/mL (ref 0.350–4.500)
Total Bilirubin: 0.6 mg/dL (ref 0.3–1.2)
Total Protein: 7.4 g/dL (ref 6.0–8.3)
Triglycerides: 109 mg/dL (ref <150)
VLDL: 22 mg/dL (ref 0–40)

## 2008-09-15 ENCOUNTER — Ambulatory Visit: Payer: Self-pay | Admitting: Family Medicine

## 2008-09-15 ENCOUNTER — Telehealth (INDEPENDENT_AMBULATORY_CARE_PROVIDER_SITE_OTHER): Payer: Self-pay | Admitting: Family Medicine

## 2008-09-17 ENCOUNTER — Ambulatory Visit: Payer: Self-pay | Admitting: Family Medicine

## 2008-09-18 ENCOUNTER — Encounter (INDEPENDENT_AMBULATORY_CARE_PROVIDER_SITE_OTHER): Payer: Self-pay | Admitting: Family Medicine

## 2008-10-16 ENCOUNTER — Ambulatory Visit: Payer: Self-pay | Admitting: Family Medicine

## 2008-10-16 DIAGNOSIS — K589 Irritable bowel syndrome without diarrhea: Secondary | ICD-10-CM | POA: Insufficient documentation

## 2008-10-18 ENCOUNTER — Encounter (INDEPENDENT_AMBULATORY_CARE_PROVIDER_SITE_OTHER): Payer: Self-pay | Admitting: Family Medicine

## 2008-11-17 ENCOUNTER — Telehealth (INDEPENDENT_AMBULATORY_CARE_PROVIDER_SITE_OTHER): Payer: Self-pay | Admitting: Family Medicine

## 2008-11-18 ENCOUNTER — Ambulatory Visit: Payer: Self-pay | Admitting: Family Medicine

## 2008-11-18 DIAGNOSIS — R1032 Left lower quadrant pain: Secondary | ICD-10-CM | POA: Insufficient documentation

## 2008-12-02 ENCOUNTER — Ambulatory Visit: Payer: Self-pay | Admitting: Family Medicine

## 2008-12-10 ENCOUNTER — Telehealth (INDEPENDENT_AMBULATORY_CARE_PROVIDER_SITE_OTHER): Payer: Self-pay | Admitting: *Deleted

## 2008-12-12 ENCOUNTER — Ambulatory Visit (HOSPITAL_COMMUNITY): Admission: RE | Admit: 2008-12-12 | Discharge: 2008-12-12 | Payer: Self-pay | Admitting: Family Medicine

## 2008-12-30 ENCOUNTER — Ambulatory Visit: Payer: Self-pay | Admitting: Family Medicine

## 2008-12-30 DIAGNOSIS — M549 Dorsalgia, unspecified: Secondary | ICD-10-CM | POA: Insufficient documentation

## 2008-12-30 DIAGNOSIS — L259 Unspecified contact dermatitis, unspecified cause: Secondary | ICD-10-CM | POA: Insufficient documentation

## 2008-12-30 LAB — CONVERTED CEMR LAB
Bilirubin Urine: NEGATIVE
Glucose, Urine, Semiquant: NEGATIVE
Nitrite: NEGATIVE
Specific Gravity, Urine: 1.01
WBC Urine, dipstick: NEGATIVE
pH: 6

## 2009-01-06 ENCOUNTER — Ambulatory Visit: Payer: Self-pay | Admitting: Family Medicine

## 2009-01-13 ENCOUNTER — Ambulatory Visit: Payer: Self-pay | Admitting: Family Medicine

## 2009-02-10 ENCOUNTER — Encounter (INDEPENDENT_AMBULATORY_CARE_PROVIDER_SITE_OTHER): Payer: Self-pay | Admitting: Family Medicine

## 2009-07-15 ENCOUNTER — Emergency Department (HOSPITAL_COMMUNITY): Admission: EM | Admit: 2009-07-15 | Discharge: 2009-07-15 | Payer: Self-pay | Admitting: Emergency Medicine

## 2009-10-19 IMAGING — US US ABDOMEN COMPLETE
1 series · 14 of 25 positions shown · non-contrast
Comparison: Correlation made with CT abdomen 11/29/2007

CLINICAL DATA: Abdominal pain, acute pancreatitis

ABDOMEN ULTRASOUND
TECHNIQUE: Complete abdominal ultrasound examination was performed
including evaluation of the liver, gallbladder, bile ducts,
pancreas, kidneys, spleen, IVC, and abdominal aorta.

[Series 1: unknown · 0.25mm/px · 14 of 62 slices shown]
[im 1/62]
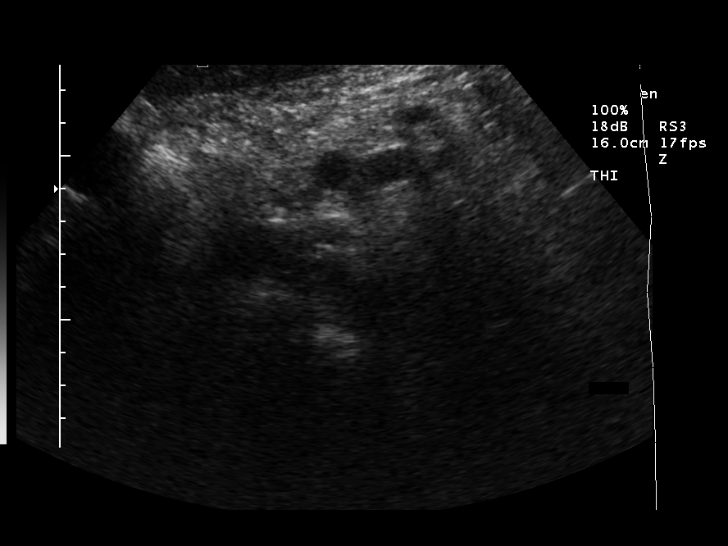
[im 6/62]
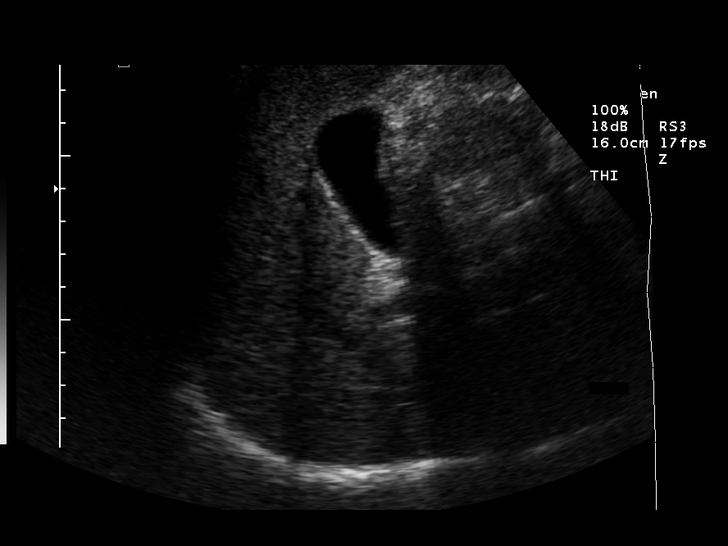
[im 11/62]
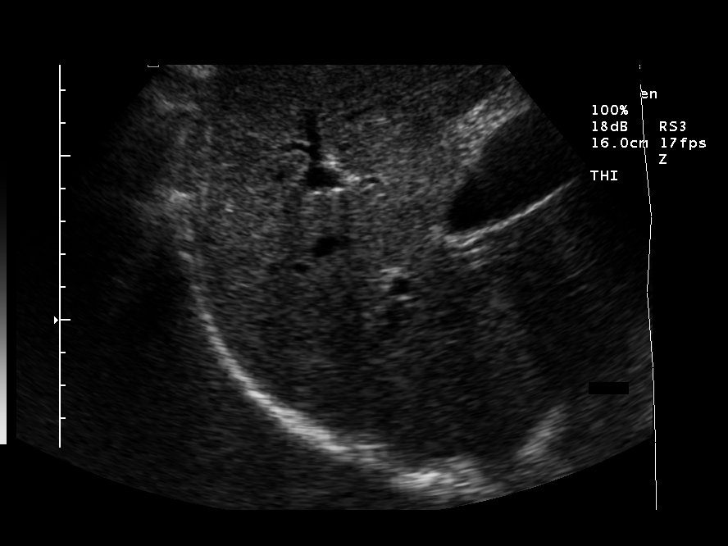
[im 16/62]
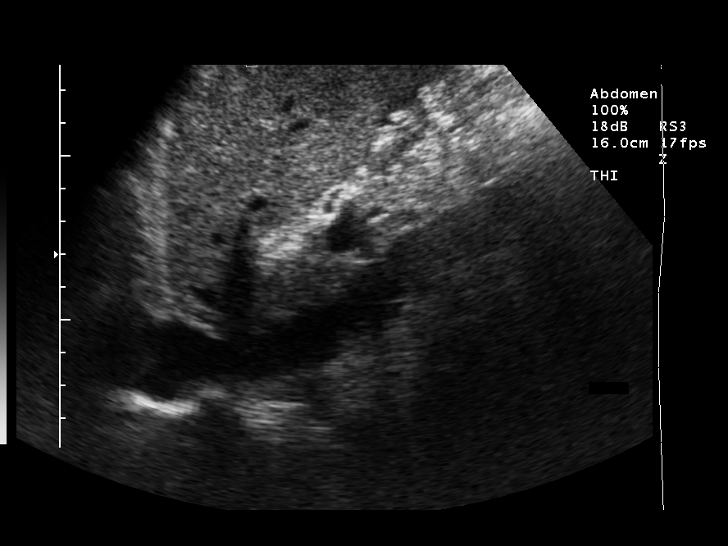
[im 21/62]
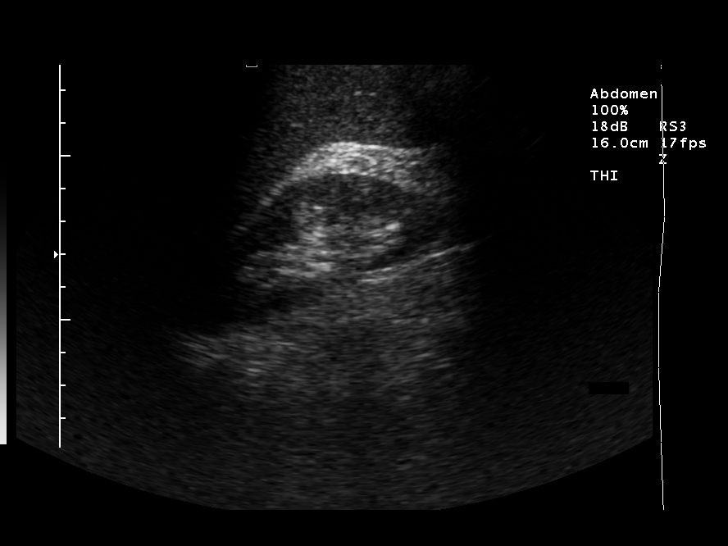
[im 23/62]
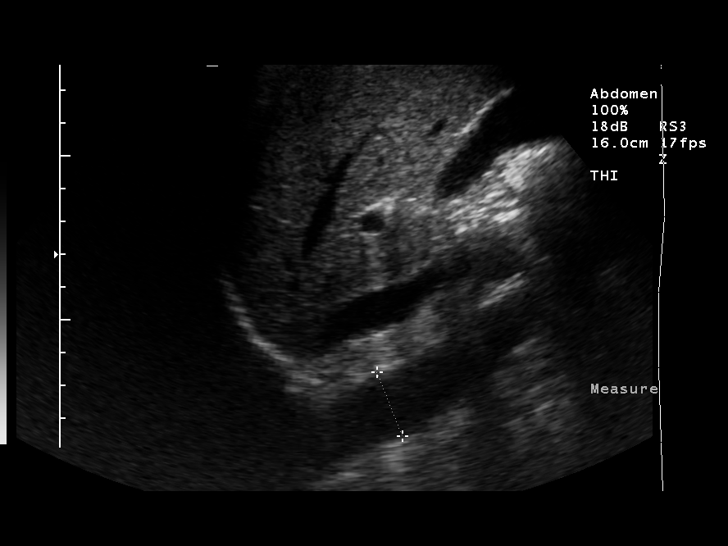
[im 28/62]
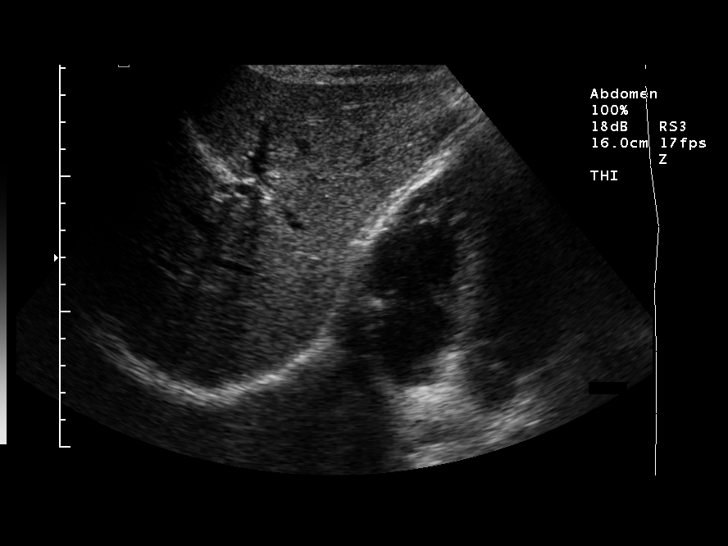
[im 34/62]
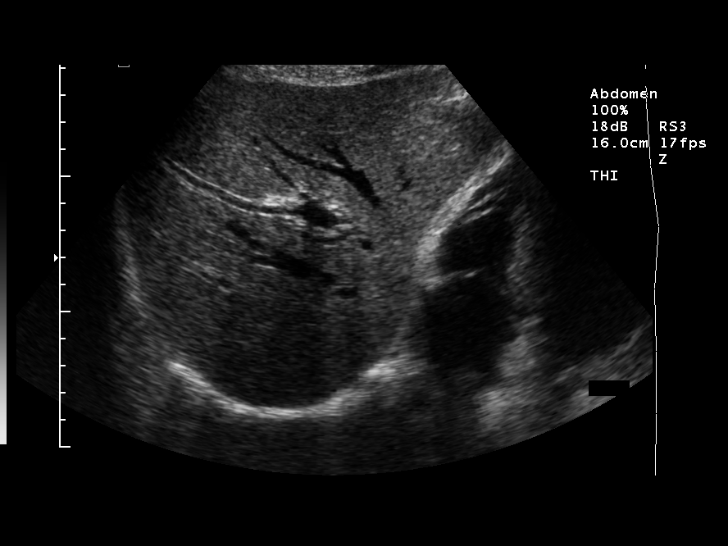
[im 39/62]
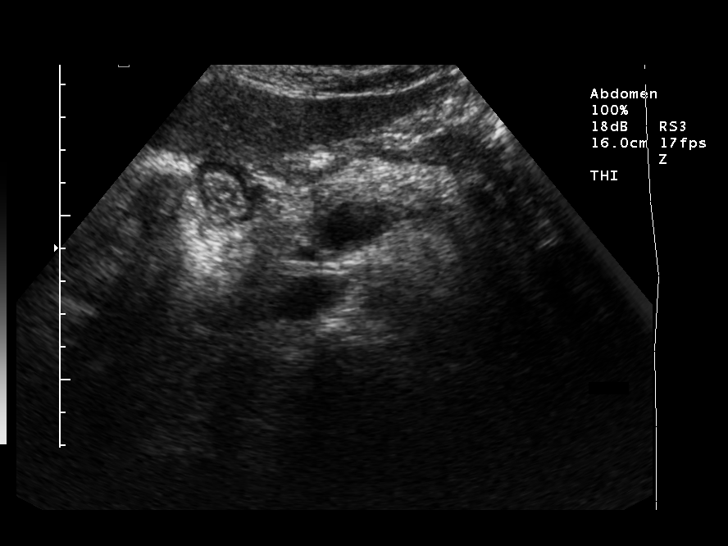
[im 41/62]
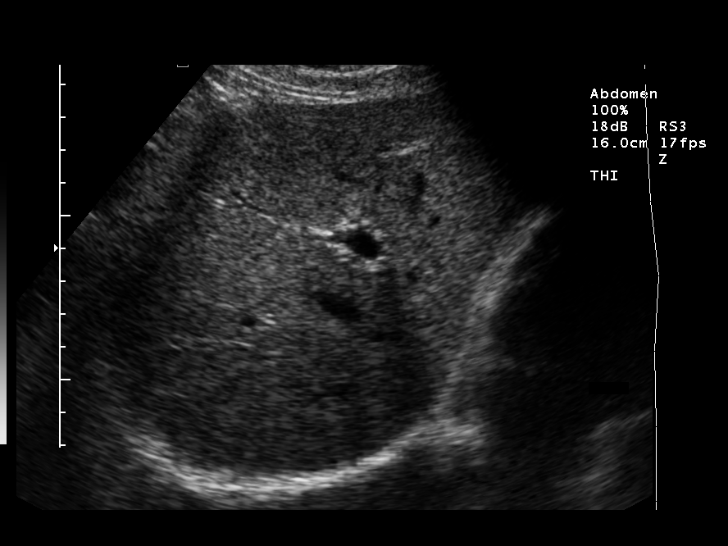
[im 46/62]
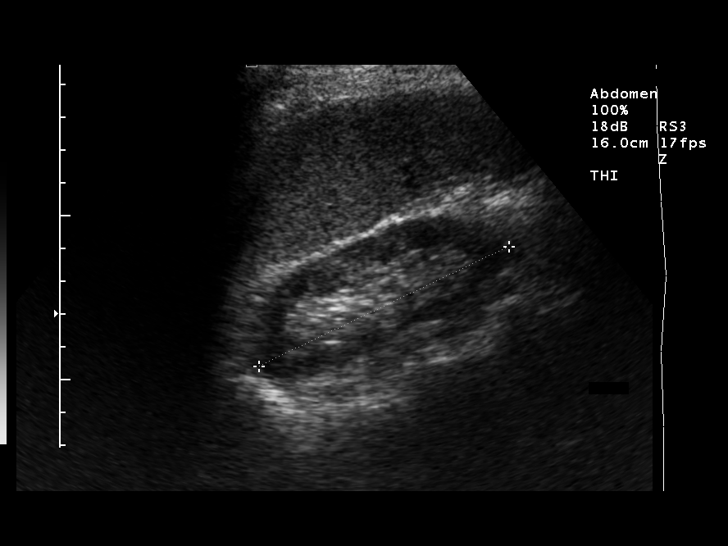
[im 51/62]
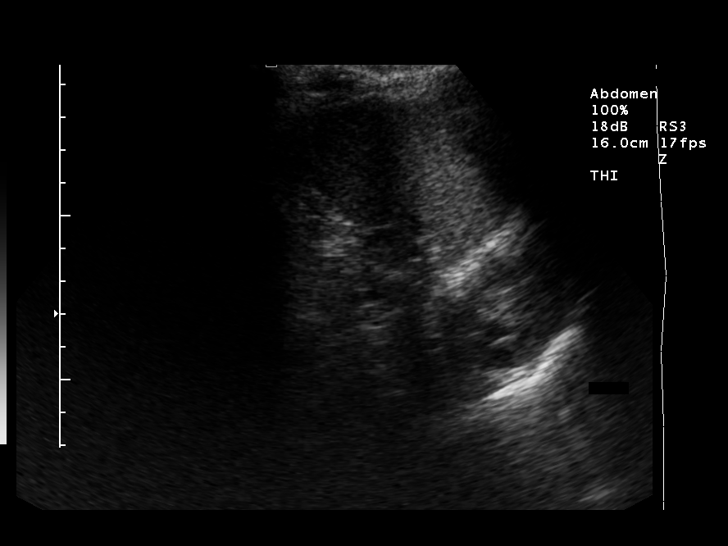
[im 56/62]
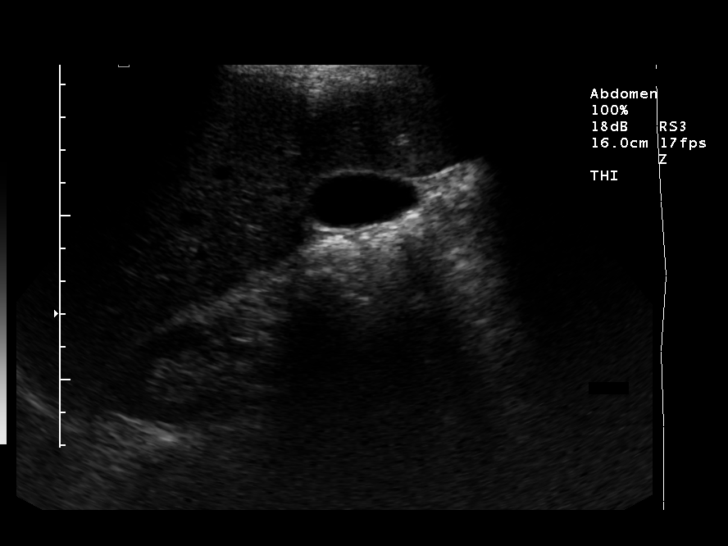
[im 62/62]
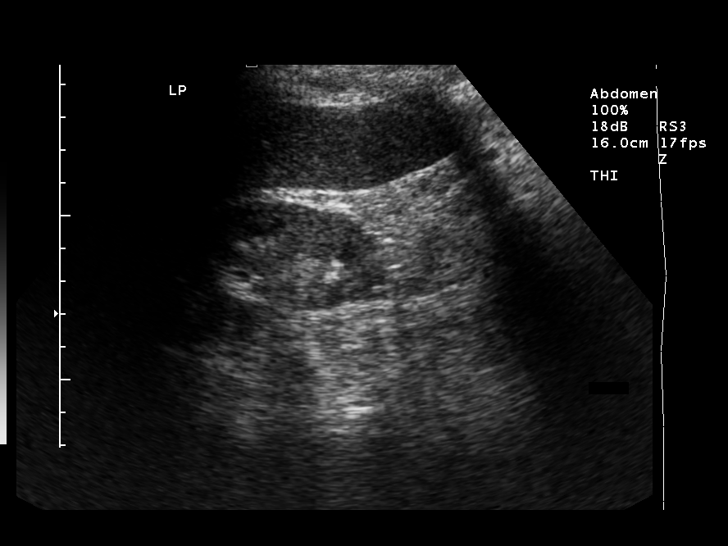

[14 of 25 positions shown; findings below may reference images not displayed]

FINDINGS: Gallbladder normal without stones or wall thickening.
Common bile duct minimally prominent 6.3 mm diameter.
No intrahepatic biliary dilatation.
Portion of pancreatic tail obscured by bowel gas.
Liver, pancreas, and spleen otherwise normal appearance, spleen,
11.8 cm length.
Kidneys normal in morphology though small in size, 8.6 cm length
right and 8.4 cm length left, question related to patient size.
No renal mass or hydronephrosis.
Mild atherosclerotic changes of abdominal aorta without aneurysm.
IVC unremarkable.
No free fluid.
IMPRESSION: Borderline dilated common bile duct, without intrahepatic biliary
dilatation, recommend correlation with LFTs.
Small kidneys, question related to patient's size.
No other upper abdominal sonographic abnormalities.

## 2010-01-21 ENCOUNTER — Emergency Department (HOSPITAL_COMMUNITY): Admission: EM | Admit: 2010-01-21 | Discharge: 2010-01-21 | Payer: Self-pay | Admitting: Emergency Medicine

## 2010-05-03 ENCOUNTER — Emergency Department: Payer: Self-pay | Admitting: Unknown Physician Specialty

## 2010-06-14 ENCOUNTER — Encounter: Payer: Self-pay | Admitting: Family Medicine

## 2010-08-11 LAB — DIFFERENTIAL
Basophils Absolute: 0 10*3/uL (ref 0.0–0.1)
Basophils Relative: 0 % (ref 0–1)
Eosinophils Absolute: 0.1 10*3/uL (ref 0.0–0.7)
Eosinophils Relative: 2 % (ref 0–5)
Lymphocytes Relative: 31 % (ref 12–46)

## 2010-08-11 LAB — BASIC METABOLIC PANEL
GFR calc non Af Amer: 51 mL/min — ABNORMAL LOW (ref >60)
Potassium: 3.5 mEq/L (ref 3.5–5.1)
Sodium: 139 mEq/L (ref 135–145)

## 2010-08-11 LAB — TROPONIN I: Troponin I: <0.01 ng/mL (ref 0.00–0.06)

## 2010-08-11 LAB — CK TOTAL AND CKMB (NOT AT ARMC)
CK, MB: 1.1 ng/mL (ref 0.3–4.0)
Relative Index: INVALID (ref 0.0–2.5)
Total CK: 76 U/L (ref 7–177)

## 2010-08-11 LAB — CBC
HCT: 36.2 % (ref 36.0–46.0)
Hemoglobin: 12.5 g/dL (ref 12.0–15.0)
RBC: 3.81 MIL/uL — ABNORMAL LOW (ref 3.87–5.11)
WBC: 5.8 10*3/uL (ref 4.0–10.5)
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
## 2010-10-05 NOTE — Op Note (Signed)
NAME:  AVNEET, [name] NO.:  [id number]   MEDICAL RECORD NO.:  [id number]          PATIENT TYPE:  AMB   LOCATION:  DAY                           FACILITY:  [facility]   PHYSICIAN:  Vickki Hearing, M.D.DATE OF BIRTH:  [birth year]   DATE OF PROCEDURE:  01/04/2008  DATE OF DISCHARGE:                               OPERATIVE REPORT   Catherine [name] is 55 years old.  She complained of left shoulder pain.  She was  treated with rest injections.  She did not improve.  She had a plain  film and MRI, which showed that she had a hypertrophic AC joint.  No  rotator cuff tear.  She failed nonoperative treatment and presented for  surgery.   PREOPERATIVE DIAGNOSIS:  Acromioclavicular osteoarthritis.   POSTOPERATIVE DIAGNOSIS:  Acromioclavicular osteoarthritis.   PROCEDURE:  Open left Mumford/excision, distal clavicle.   SURGEON:  Vickki Hearing, MD   ASSISTANT:  Valetta Close   ANESTHESIA:  General.   FINDINGS:  Loose body in the [facility] joint and large hypertrophic distal  clavicle.  Specimen was sent to path via ID to send confirmation.  Case  was clean.  Antibiotics were started within a hour of skin incision.   The patient was identified as Catherine [name].  She marked her left  shoulder as a surgical site.  I countersigned that, updated history and  physical, started her Ancef, and took her to surgery for general  anesthetic.  In the beach chair position, her left upper extremity was  prepped and draped in sterile technique.   Time-out procedure was performed, completed, and open left distal  clavicle excision confirmed as the proper procedure on Catherine [name].   Subcu tissues were injected with Marcaine and epinephrine solution.  Transverse incision was made over the [facility] joint.  Subcutaneous tissue was  divided.  Periosteum was split in line with skin incision.  Subperiosteal dissection was performed to the distal aspect of the  clavicle, was visualized along with the  acromioclavicular joint.  Loose  body was found.  Joint was removed.  An 1 cm of clavicle was excised  using a ruler to measure and an oscillating saw.   Posterior edge was beveled, inferior edge was beveled, and anterior edge  was beveled.  Wound was irrigated.  Hemostasis was attained with  electrocautery.  Bone end was covered with bone wax closure.   Closure was performed with #2 Monocryl in two layers.  Pain pump  catheter was inserted.  A 3-0 nylon running subcuticular stitch with  Steri-Strips.  We injected a total of 16 mL of Marcaine with epinephrine  in the subcu tissue.  Sterile dressing and Cryo/Cuff were applied.  The patient extubated and  taken to recovery room in stable condition.  The patient will follow up  next week.  She is discharged on Percocet 5/325 and Robaxin 500, #90 and  #60 respectively, with two refills on the Robaxin.      Vickki Hearing, M.D.  Electronically Signed     SEH/MEDQ  D:  01/04/2008  T:  01/05/2008  Job:  [id number] 
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
## 2010-10-08 NOTE — Op Note (Signed)
NAME:  Catherine [name], Catherine [name]                ACCOUNT NO.:  [id number]   MEDICAL RECORD NO.:  [id number]          PATIENT TYPE:  AMB   LOCATION:  DAY                           FACILITY:  [facility]   PHYSICIAN:  R. Roetta Sessions, M.D. DATE OF BIRTH:  10/14/1955   DATE OF PROCEDURE:  04/07/2006  DATE OF DISCHARGE:                                  PROCEDURE NOTE   PROCEDURE:  Screening colonoscopy.   ENDOSCOPIST:  Jonathon Bellows, M.D.   INDICATIONS FOR PROCEDURE:  The patient is a 55 year old lady sent over at  the courtesy of Dr. Remi Haggard for operative colorectal cancer screening.  She is devoid of any lower GI tract symptoms.  She has never her colon  imaged previously.  Family history is unknown as she was adopted.  Colonoscopy is now being done as a standard screening maneuver.  This  approach has been discussed with the patient at length and potential risks,  benefits and alternatives have been reviewed and questions answered.  She is  agreeable.  Please see documentation in the medical record.   PROCEDURE NOTE:  O2 saturation, blood pressure, pulse and respirations were  monitored throughout the entire procedure.   CONSCIOUS SEDATION:  Versed 8 mg IV, Demerol 150 mg IV in divided doses,  Phenergan 25 mg diluted slow IV push at the outset of the procedure.   INSTRUMENT:  Olympus video chip system, pediatric scope.   FINDINGS:  Digital rectal exam revealed no abnormalities.   ENDOSCOPIC FINDINGS:  The prep was excellent.   RECTUM:  Examination of the rectal mucosa including a retroflexed view of  the anal verge revealed no abnormalities.   COLON:  The colonic mucosa was surveyed from the rectosigmoid junction  through the left, transverse and right colon to the area of the appendiceal  orifice, ileocecal valve and cecum.  These structures were well seen and  photographed for the record.  From this level, the scope was slowly  withdrawn.  All previously mentioned mucosal surfaces were  again seen.  The  colonic mucosa appeared normal.  The patient tolerated the procedure well  and was reactive at endoscopy.   IMPRESSION:  Normal rectum and colon.   RECOMMENDATIONS:  Repeat screening colonoscopy in 10 years.      Jonathon Bellows, M.D.  Electronically Signed     RMR/MEDQ  D:  04/07/2006  T:  04/07/2006  Job:  [id number]

## 2010-11-02 DIAGNOSIS — Z79899 Other long term (current) drug therapy: Secondary | ICD-10-CM | POA: Insufficient documentation

## 2010-12-11 ENCOUNTER — Emergency Department (HOSPITAL_COMMUNITY)
Admission: EM | Admit: 2010-12-11 | Discharge: 2010-12-12 | Disposition: A | Discharge status: Home / Self Care | Payer: Medicaid Other | Attending: Emergency Medicine | Admitting: Emergency Medicine

## 2010-12-11 ENCOUNTER — Encounter: Payer: Self-pay | Admitting: *Deleted

## 2010-12-11 DIAGNOSIS — S61219A Laceration without foreign body of unspecified finger without damage to nail, initial encounter: Secondary | ICD-10-CM

## 2010-12-11 DIAGNOSIS — S61209A Unspecified open wound of unspecified finger without damage to nail, initial encounter: Secondary | ICD-10-CM | POA: Insufficient documentation

## 2010-12-11 DIAGNOSIS — F341 Dysthymic disorder: Secondary | ICD-10-CM | POA: Insufficient documentation

## 2010-12-11 DIAGNOSIS — W261XXA Contact with sword or dagger, initial encounter: Secondary | ICD-10-CM | POA: Insufficient documentation

## 2010-12-11 DIAGNOSIS — W260XXA Contact with knife, initial encounter: Secondary | ICD-10-CM | POA: Insufficient documentation

## 2010-12-11 HISTORY — DX: Anxiety disorder, unspecified: F41.9

## 2010-12-11 HISTORY — DX: Major depressive disorder, single episode, unspecified: F32.9

## 2010-12-11 HISTORY — DX: Depression, unspecified: F32.A

## 2010-12-11 MED ORDER — TETANUS-DIPHTHERIA TOXOIDS TD 5-2 LFU IM INJ
0.5000 mL | INJECTION | Freq: Once | INTRAMUSCULAR | Status: AC
  Administered 2010-12-11: 0.5 mL via INTRAMUSCULAR
  Filled 2010-12-11: qty 0.5

## 2010-12-11 MED ORDER — ONDANSETRON HCL 4 MG PO TABS
4.0000 mg | ORAL_TABLET | Freq: Once | ORAL | Status: AC
  Administered 2010-12-11: 4 mg via ORAL
  Filled 2010-12-11 (×2): qty 1

## 2010-12-11 MED ORDER — HYDROCODONE-ACETAMINOPHEN 5-325 MG PO TABS
2.0000 | ORAL_TABLET | Freq: Once | ORAL | Status: AC
  Administered 2010-12-11: 2 via ORAL
  Filled 2010-12-11 (×2): qty 2

## 2010-12-11 MED ORDER — LIDOCAINE HCL (PF) 1 % IJ SOLN
5.0000 mL | Freq: Once | INTRAMUSCULAR | Status: AC
  Administered 2010-12-11: 5 mL
  Filled 2010-12-11: qty 5

## 2010-12-11 NOTE — ED Notes (Addendum)
Pt has laceration to left pointer finger. Bleeding controlled.

## 2010-12-11 NOTE — ED Provider Notes (Signed)
History     Chief Complaint  Patient presents with  . Laceration   Patient is a 55 y.o. female presenting with skin laceration. The history is provided by the patient.  Laceration  The incident occurred less than 1 hour ago. The laceration is located on the left hand. The laceration is 1 cm in size. The laceration mechanism was a a clean knife. The pain is moderate. She reports no foreign bodies present. Her tetanus status is unknown.    Past Medical History  Diagnosis Date  . Anxiety   . Depression     Past Surgical History  Procedure Date  . Abdominal hysterectomy     Family History  Problem Relation Age of Onset  . Adopted: Yes    History  Substance Use Topics  . Smoking status: Never Smoker   . Smokeless tobacco: Not on file  . Alcohol Use: No    OB History    Grav Para Term Preterm Abortions TAB SAB Ect Mult Living                  Review of Systems  Constitutional: Negative for activity change.       All ROS Neg except as noted in HPI  HENT: Negative for nosebleeds and neck pain.   Eyes: Negative for photophobia and discharge.  Respiratory: Negative for cough, shortness of breath and wheezing.   Cardiovascular: Negative for chest pain and palpitations.  Gastrointestinal: Negative for abdominal pain and blood in stool.  Genitourinary: Negative for dysuria, frequency and hematuria.  Musculoskeletal: Negative for back pain and arthralgias.  Skin: Negative.        laceration  Neurological: Positive for numbness. Negative for dizziness, seizures and speech difficulty.  Psychiatric/Behavioral: Negative for hallucinations and confusion.    Physical Exam  BP 114/69  Pulse 60  Temp(Src) 98.3 F (36.8 C) (Oral)  Resp 20  Ht 5\' 2"  (1.575 m)  Wt 115 lb (52.164 kg)  BMI 21.03 kg/m2  SpO2 92%  Physical Exam  Nursing note and vitals reviewed. Constitutional: She is oriented to person, place, and time. She appears well-developed and well-nourished.   Non-toxic appearance.  HENT:  Head: Normocephalic.  Right Ear: Tympanic membrane and external ear normal.  Left Ear: Tympanic membrane and external ear normal.  Eyes: EOM and lids are normal. Pupils are equal, round, and reactive to light.  Neck: Normal range of motion. Neck supple. Carotid bruit is not present.  Cardiovascular: Normal rate, regular rhythm, normal heart sounds, intact distal pulses and normal pulses.   Pulmonary/Chest: Breath sounds normal. No respiratory distress.  Abdominal: Soft. Bowel sounds are normal. There is no tenderness. There is no guarding.  Musculoskeletal:       Laceration of the left index finger. No bone or tendon involvement.  Lymphadenopathy:       Head (right side): No submandibular adenopathy present.       Head (left side): No submandibular adenopathy present.    She has no cervical adenopathy.  Neurological: She is alert and oriented to person, place, and time. She has normal strength. No cranial nerve deficit or sensory deficit.  Skin: Skin is warm and dry.  Psychiatric: She has a normal mood and affect. Her speech is normal.    ED Course  LACERATION REPAIR Date/Time: 12/11/2010 11:47 PM Performed by: Kathie Dike Authorized by: Lyanne Co Consent: Verbal consent obtained. Consent given by: patient Patient identity confirmed: arm band Time out: Immediately prior to procedure  a "time out" was called to verify the correct patient, procedure, equipment, support staff and site/side marked as required. Location: left index finger. Laceration length: 1.3 cm Foreign bodies: no foreign bodies Tendon involvement: none Vascular damage: no Anesthesia: digital block Local anesthetic: lidocaine 1% without epinephrine Preparation: Patient was prepped and draped in the usual sterile fashion. Irrigation solution: saline Irrigation method: syringe Amount of cleaning: standard Debridement: none Degree of undermining: none Skin closure: 4-0  nylon Number of sutures: 3 Technique: simple Approximation: close Approximation difficulty: simple Patient tolerance: Patient tolerated the procedure well with no immediate complications. Comments: Dressing applied by nursing staff.    MDM       Kathie Dike, PA 12/11/10 [phone]

## 2010-12-11 NOTE — ED Notes (Signed)
Pt c/o tip of finger being numb.

## 2010-12-12 NOTE — ED Notes (Signed)
Pt has no complaints, tolerated meds well.

## 2010-12-14 NOTE — ED Provider Notes (Signed)
Medical screening examination/treatment/procedure(s) were performed by non-physician practitioner and as supervising physician I was immediately available for consultation/collaboration.   Lyanne Co, MD 12/14/10 [phone]

## 2011-02-18 LAB — BASIC METABOLIC PANEL
CO2: 33 — ABNORMAL HIGH
Calcium: 9.5
Chloride: 105
Creatinine, Ser: 0.86
Glucose, Bld: 92

## 2011-02-28 LAB — CBC
HCT: 36
Hemoglobin: 12.8
MCV: 92.6
Platelets: 204
RDW: 12.3

## 2011-02-28 LAB — DIFFERENTIAL
Basophils Absolute: 0
Eosinophils Absolute: 0.1 — ABNORMAL LOW
Eosinophils Relative: 2
Lymphs Abs: 2.6
Monocytes Absolute: 0.7

## 2011-02-28 LAB — PROTIME-INR: Prothrombin Time: 13.4

## 2011-05-06 DIAGNOSIS — D481 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: Secondary | ICD-10-CM | POA: Insufficient documentation

## 2011-05-06 DIAGNOSIS — Q8501 Neurofibromatosis, type 1: Secondary | ICD-10-CM | POA: Insufficient documentation

## 2011-09-29 ENCOUNTER — Encounter: Payer: Self-pay | Admitting: Anesthesiology

## 2011-10-22 ENCOUNTER — Encounter: Payer: Self-pay | Admitting: Anesthesiology

## 2011-11-21 ENCOUNTER — Encounter: Payer: Self-pay | Admitting: Anesthesiology

## 2012-05-06 ENCOUNTER — Emergency Department (HOSPITAL_COMMUNITY): Payer: Medicaid Other

## 2012-05-06 ENCOUNTER — Emergency Department (HOSPITAL_COMMUNITY)
Admission: EM | Admit: 2012-05-06 | Discharge: 2012-05-07 | Disposition: A | Discharge status: Home / Self Care | Payer: Medicaid Other | Attending: Emergency Medicine | Admitting: Emergency Medicine

## 2012-05-06 ENCOUNTER — Encounter (HOSPITAL_COMMUNITY): Payer: Self-pay | Admitting: Emergency Medicine

## 2012-05-06 DIAGNOSIS — S060X0A Concussion without loss of consciousness, initial encounter: Secondary | ICD-10-CM | POA: Insufficient documentation

## 2012-05-06 DIAGNOSIS — Z87828 Personal history of other (healed) physical injury and trauma: Secondary | ICD-10-CM | POA: Insufficient documentation

## 2012-05-06 DIAGNOSIS — IMO0002 Reserved for concepts with insufficient information to code with codable children: Secondary | ICD-10-CM | POA: Insufficient documentation

## 2012-05-06 DIAGNOSIS — F1920 Other psychoactive substance dependence, uncomplicated: Secondary | ICD-10-CM | POA: Insufficient documentation

## 2012-05-06 DIAGNOSIS — F329 Major depressive disorder, single episode, unspecified: Secondary | ICD-10-CM | POA: Insufficient documentation

## 2012-05-06 DIAGNOSIS — Y9389 Activity, other specified: Secondary | ICD-10-CM | POA: Insufficient documentation

## 2012-05-06 DIAGNOSIS — S161XXA Strain of muscle, fascia and tendon at neck level, initial encounter: Secondary | ICD-10-CM

## 2012-05-06 DIAGNOSIS — G8929 Other chronic pain: Secondary | ICD-10-CM

## 2012-05-06 DIAGNOSIS — S060X9A Concussion with loss of consciousness of unspecified duration, initial encounter: Secondary | ICD-10-CM

## 2012-05-06 DIAGNOSIS — S20229A Contusion of unspecified back wall of thorax, initial encounter: Secondary | ICD-10-CM | POA: Insufficient documentation

## 2012-05-06 DIAGNOSIS — S93409A Sprain of unspecified ligament of unspecified ankle, initial encounter: Secondary | ICD-10-CM | POA: Insufficient documentation

## 2012-05-06 DIAGNOSIS — F1120 Opioid dependence, uncomplicated: Secondary | ICD-10-CM

## 2012-05-06 DIAGNOSIS — W11XXXA Fall on and from ladder, initial encounter: Secondary | ICD-10-CM | POA: Insufficient documentation

## 2012-05-06 DIAGNOSIS — Y929 Unspecified place or not applicable: Secondary | ICD-10-CM | POA: Insufficient documentation

## 2012-05-06 DIAGNOSIS — S93402A Sprain of unspecified ligament of left ankle, initial encounter: Secondary | ICD-10-CM

## 2012-05-06 DIAGNOSIS — S139XXA Sprain of joints and ligaments of unspecified parts of neck, initial encounter: Secondary | ICD-10-CM | POA: Insufficient documentation

## 2012-05-06 DIAGNOSIS — F3289 Other specified depressive episodes: Secondary | ICD-10-CM | POA: Insufficient documentation

## 2012-05-06 DIAGNOSIS — Z79899 Other long term (current) drug therapy: Secondary | ICD-10-CM | POA: Insufficient documentation

## 2012-05-06 DIAGNOSIS — W19XXXA Unspecified fall, initial encounter: Secondary | ICD-10-CM

## 2012-05-06 DIAGNOSIS — F411 Generalized anxiety disorder: Secondary | ICD-10-CM | POA: Insufficient documentation

## 2012-05-06 HISTORY — DX: Accidental discharge from unspecified firearms or gun, initial encounter: W34.00XA

## 2012-05-06 MED ORDER — FENTANYL CITRATE 0.05 MG/ML IJ SOLN
100.0000 ug | Freq: Once | INTRAMUSCULAR | Status: AC
  Administered 2012-05-06: 100 ug via NASAL
  Filled 2012-05-06: qty 2
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
## 2012-05-07 MED ORDER — OXYCODONE-ACETAMINOPHEN 5-325 MG PO TABS: 2.0000 | ORAL_TABLET | ORAL | Status: DC | PRN

## 2013-03-15 ENCOUNTER — Emergency Department: Payer: Self-pay | Admitting: Emergency Medicine

## 2013-03-15 LAB — CBC
HCT: 37.7 % (ref 35.0–47.0)
MCH: 32.9 pg (ref 26.0–34.0)
MCHC: 35.1 g/dL (ref 32.0–36.0)
RBC: 4.03 10*6/uL (ref 3.80–5.20)
RDW: 12.6 % (ref 11.5–14.5)
WBC: 7.4 10*3/uL (ref 3.6–11.0)

## 2013-03-15 LAB — URINALYSIS, COMPLETE
Bilirubin,UR: NEGATIVE
Blood: NEGATIVE
Ph: 6 (ref 4.5–8.0)
Protein: NEGATIVE
RBC,UR: 1 /HPF (ref 0–5)
Specific Gravity: 1.005 (ref 1.003–1.030)
WBC UR: <1 /HPF (ref 0–5)

## 2013-03-15 LAB — COMPREHENSIVE METABOLIC PANEL
BUN: 8 mg/dL (ref 7–18)
Chloride: 103 mmol/L (ref 98–107)
Co2: 33 mmol/L — ABNORMAL HIGH (ref 21–32)
EGFR (African American): >60
EGFR (Non-African Amer.): 58 — ABNORMAL LOW
Potassium: 3.2 mmol/L — ABNORMAL LOW (ref 3.5–5.1)
Total Protein: 7.3 g/dL (ref 6.4–8.2)

## 2013-06-03 ENCOUNTER — Ambulatory Visit: Payer: Self-pay | Admitting: Family Medicine

## 2013-12-13 DIAGNOSIS — IMO0001 Reserved for inherently not codable concepts without codable children: Secondary | ICD-10-CM | POA: Insufficient documentation

## 2014-03-03 DIAGNOSIS — S83282A Other tear of lateral meniscus, current injury, left knee, initial encounter: Secondary | ICD-10-CM | POA: Insufficient documentation

## 2014-03-11 ENCOUNTER — Encounter: Payer: Self-pay | Admitting: Orthopedic Surgery

## 2014-03-23 ENCOUNTER — Encounter: Payer: Self-pay | Admitting: Orthopedic Surgery

## 2015-01-15 ENCOUNTER — Other Ambulatory Visit: Payer: Self-pay | Admitting: Physician Assistant

## 2015-01-15 DIAGNOSIS — R928 Other abnormal and inconclusive findings on diagnostic imaging of breast: Secondary | ICD-10-CM

## 2015-02-16 ENCOUNTER — Other Ambulatory Visit: Payer: Medicaid Other

## 2015-02-16 ENCOUNTER — Ambulatory Visit: Payer: Medicaid Other | Attending: Physician Assistant

## 2015-02-27 ENCOUNTER — Ambulatory Visit
Admission: RE | Admit: 2015-02-27 | Discharge: 2015-02-27 | Disposition: A | Discharge status: Home / Self Care | Payer: Medicaid Other | Source: Ambulatory Visit | Attending: Physician Assistant | Admitting: Physician Assistant

## 2015-02-27 ENCOUNTER — Other Ambulatory Visit: Payer: Self-pay | Admitting: Physician Assistant

## 2015-02-27 DIAGNOSIS — R928 Other abnormal and inconclusive findings on diagnostic imaging of breast: Secondary | ICD-10-CM

## 2015-02-27 DIAGNOSIS — N63 Unspecified lump in breast: Secondary | ICD-10-CM | POA: Diagnosis not present

## 2015-05-27 ENCOUNTER — Other Ambulatory Visit (HOSPITAL_COMMUNITY): Payer: Self-pay | Admitting: Family Medicine

## 2015-05-27 DIAGNOSIS — R634 Abnormal weight loss: Secondary | ICD-10-CM

## 2015-05-27 DIAGNOSIS — R109 Unspecified abdominal pain: Secondary | ICD-10-CM

## 2015-06-01 ENCOUNTER — Ambulatory Visit (HOSPITAL_COMMUNITY)
Admission: RE | Admit: 2015-06-01 | Discharge: 2015-06-01 | Disposition: A | Discharge status: Home / Self Care | Payer: Medicaid Other | Source: Ambulatory Visit | Attending: Family Medicine | Admitting: Family Medicine

## 2015-06-08 ENCOUNTER — Ambulatory Visit (HOSPITAL_COMMUNITY): Payer: Medicaid Other

## 2015-06-11 ENCOUNTER — Ambulatory Visit (HOSPITAL_COMMUNITY): Admission: RE | Admit: 2015-06-11 | Payer: Medicaid Other | Source: Ambulatory Visit

## 2015-08-31 DIAGNOSIS — Z0289 Encounter for other administrative examinations: Secondary | ICD-10-CM | POA: Insufficient documentation

## 2015-10-28 ENCOUNTER — Other Ambulatory Visit: Payer: Self-pay | Admitting: Student

## 2015-10-28 DIAGNOSIS — M7581 Other shoulder lesions, right shoulder: Secondary | ICD-10-CM

## 2015-10-28 DIAGNOSIS — M7521 Bicipital tendinitis, right shoulder: Secondary | ICD-10-CM

## 2015-11-14 ENCOUNTER — Other Ambulatory Visit: Payer: Self-pay

## 2015-11-14 ENCOUNTER — Encounter (HOSPITAL_COMMUNITY): Payer: Self-pay | Admitting: *Deleted

## 2015-11-14 ENCOUNTER — Emergency Department (HOSPITAL_COMMUNITY): Payer: Medicaid Other

## 2015-11-14 ENCOUNTER — Emergency Department (HOSPITAL_COMMUNITY)
Admission: EM | Admit: 2015-11-14 | Discharge: 2015-11-14 | Disposition: A | Discharge status: Home / Self Care | Payer: Medicaid Other | Attending: Emergency Medicine | Admitting: Emergency Medicine

## 2015-11-14 DIAGNOSIS — Z79899 Other long term (current) drug therapy: Secondary | ICD-10-CM | POA: Insufficient documentation

## 2015-11-14 DIAGNOSIS — F329 Major depressive disorder, single episode, unspecified: Secondary | ICD-10-CM | POA: Diagnosis not present

## 2015-11-14 DIAGNOSIS — M7989 Other specified soft tissue disorders: Secondary | ICD-10-CM | POA: Diagnosis present

## 2015-11-14 DIAGNOSIS — J449 Chronic obstructive pulmonary disease, unspecified: Secondary | ICD-10-CM | POA: Insufficient documentation

## 2015-11-14 DIAGNOSIS — R52 Pain, unspecified: Secondary | ICD-10-CM

## 2015-11-14 HISTORY — DX: Chronic obstructive pulmonary disease, unspecified: J44.9

## 2015-11-14 LAB — COMPREHENSIVE METABOLIC PANEL
ALT: 19 U/L (ref 14–54)
AST: 33 U/L (ref 15–41)
Albumin: 3.4 g/dL — ABNORMAL LOW (ref 3.5–5.0)
Alkaline Phosphatase: 40 U/L (ref 38–126)
Anion gap: 5 (ref 5–15)
BUN: 9 mg/dL (ref 6–20)
CHLORIDE: 105 mmol/L (ref 101–111)
CO2: 29 mmol/L (ref 22–32)
CREATININE: 1 mg/dL (ref 0.44–1.00)
Calcium: 8.3 mg/dL — ABNORMAL LOW (ref 8.9–10.3)
GFR calc non Af Amer: 60 mL/min — ABNORMAL LOW (ref >60)
Glucose, Bld: 81 mg/dL (ref 65–99)
POTASSIUM: 3.8 mmol/L (ref 3.5–5.1)
SODIUM: 139 mmol/L (ref 135–145)
Total Bilirubin: 0.8 mg/dL (ref 0.3–1.2)
Total Protein: 5.9 g/dL — ABNORMAL LOW (ref 6.5–8.1)

## 2015-11-14 LAB — CBC WITH DIFFERENTIAL/PLATELET
BASOS ABS: 0.1 10*3/uL (ref 0.0–0.1)
BASOS PCT: 1 %
EOS ABS: 0.3 10*3/uL (ref 0.0–0.7)
EOS PCT: 5 %
HCT: 33.8 % — ABNORMAL LOW (ref 36.0–46.0)
Hemoglobin: 11.1 g/dL — ABNORMAL LOW (ref 12.0–15.0)
Lymphocytes Relative: 29 %
Lymphs Abs: 1.6 10*3/uL (ref 0.7–4.0)
MCH: 31.5 pg (ref 26.0–34.0)
MCHC: 32.8 g/dL (ref 30.0–36.0)
MCV: 96 fL (ref 78.0–100.0)
MONO ABS: 0.8 10*3/uL (ref 0.1–1.0)
Monocytes Relative: 15 %
Neutro Abs: 2.8 10*3/uL (ref 1.7–7.7)
Neutrophils Relative %: 50 %
PLATELETS: 190 10*3/uL (ref 150–400)
RBC: 3.52 MIL/uL — AB (ref 3.87–5.11)
RDW: 13.1 % (ref 11.5–15.5)
WBC: 5.5 10*3/uL (ref 4.0–10.5)

## 2015-11-14 LAB — BRAIN NATRIURETIC PEPTIDE: B NATRIURETIC PEPTIDE 5: 73 pg/mL (ref 0.0–100.0)

## 2015-11-14 LAB — TROPONIN I

## 2015-11-14 MED ORDER — FUROSEMIDE 20 MG PO TABS: 20.0000 mg | ORAL_TABLET | Freq: Every day | ORAL | Status: DC

## 2015-11-14 MED ORDER — FUROSEMIDE 40 MG PO TABS
40.0000 mg | ORAL_TABLET | Freq: Once | ORAL | Status: AC
  Administered 2015-11-14: 40 mg via ORAL
  Filled 2015-11-14: qty 1
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
## 2015-11-14 NOTE — ED Notes (Signed)
Pt comes in with bilateral leg swelling starting this morning. Denies any shortness of breath.

## 2015-11-14 NOTE — ED Notes (Signed)
Discharge instructions reviewed. Ultrasound of legs scheduled for Monday at 1130, patient made aware and given appt card.

## 2015-11-14 NOTE — Discharge Instructions (Signed)
Edema °Edema is an abnormal buildup of fluids in your body tissues. Edema is somewhat dependent on gravity to pull the fluid to the lowest place in your body. That makes the condition more common in the legs and thighs (lower extremities). Painless swelling of the feet and ankles is common and becomes more likely as you get older. It is also common in looser tissues, like around your eyes.  °When the affected area is squeezed, the fluid may move out of that spot and leave a dent for a few moments. This dent is called pitting.  °CAUSES  °There are many possible causes of edema. Eating too much salt and being on your feet or sitting for a long time can cause edema in your legs and ankles. Hot weather may make edema worse. Common medical causes of edema include: °· Heart failure. °· Liver disease. °· Kidney disease. °· Weak blood vessels in your legs. °· Cancer. °· An injury. °· Pregnancy. °· Some medications. °· Obesity.  °SYMPTOMS  °Edema is usually painless. Your skin may look swollen or shiny.  °DIAGNOSIS  °Your health care provider may be able to diagnose edema by asking about your medical history and doing a physical exam. You may need to have tests such as X-rays, an electrocardiogram, or blood tests to check for medical conditions that may cause edema.  °TREATMENT  °Edema treatment depends on the cause. If you have heart, liver, or kidney disease, you need the treatment appropriate for these conditions. General treatment may include: °· Elevation of the affected body part above the level of your heart. °· Compression of the affected body part. Pressure from elastic bandages or support stockings squeezes the tissues and forces fluid back into the blood vessels. This keeps fluid from entering the tissues. °· Restriction of fluid and salt intake. °· Use of a water pill (diuretic). These medications are appropriate only for some types of edema. They pull fluid out of your body and make you urinate more often. This  gets rid of fluid and reduces swelling, but diuretics can have side effects. Only use diuretics as directed by your health care provider. °HOME CARE INSTRUCTIONS  °· Keep the affected body part above the level of your heart when you are lying down.   °· Do not sit still or stand for prolonged periods.   °· Do not put anything directly under your knees when lying down. °· Do not wear constricting clothing or garters on your upper legs.   °· Exercise your legs to work the fluid back into your blood vessels. This may help the swelling go down.   °· Wear elastic bandages or support stockings to reduce ankle swelling as directed by your health care provider.   °· Eat a low-salt diet to reduce fluid if your health care provider recommends it.   °· Only take medicines as directed by your health care provider.  °SEEK MEDICAL CARE IF:  °· Your edema is not responding to treatment. °· You have heart, liver, or kidney disease and notice symptoms of edema. °· You have edema in your legs that does not improve after elevating them.   °· You have sudden and unexplained weight gain. °SEEK IMMEDIATE MEDICAL CARE IF:  °· You develop shortness of breath or chest pain.   °· You cannot breathe when you lie down. °· You develop pain, redness, or warmth in the swollen areas.   °· You have heart, liver, or kidney disease and suddenly get edema. °· You have a fever and your symptoms suddenly get worse. °MAKE SURE YOU:  °·   Understand these instructions. °· Will watch your condition. °· Will get help right away if you are not doing well or get worse. °  °This information is not intended to replace advice given to you by your health care provider. Make sure you discuss any questions you have with your health care provider. °  °Document Released: 05/09/2005 Document Revised: 05/30/2014 Document Reviewed: 03/01/2013 °Elsevier Interactive Patient Education ©2016 [employer] ° °

## 2015-11-16 ENCOUNTER — Ambulatory Visit (HOSPITAL_COMMUNITY)
Admit: 2015-11-16 | Discharge: 2015-11-16 | Disposition: A | Discharge status: Home / Self Care | Payer: Medicaid Other | Attending: Emergency Medicine | Admitting: Emergency Medicine

## 2015-11-16 DIAGNOSIS — R52 Pain, unspecified: Secondary | ICD-10-CM | POA: Insufficient documentation

## 2015-11-16 NOTE — ED Provider Notes (Signed)
Patient is here for follow-up study to evaluate for blood clots. The study is negative at this time. Patient will start fluid pills as previously directed by previous physician and follow-up with her primary doctor within 1 week. If she has continued symptoms she will also discuss it with her primary doctor.   Merrily Pew, MD 11/16/15 [phone]

## 2015-11-19 ENCOUNTER — Ambulatory Visit
Admission: RE | Admit: 2015-11-19 | Discharge: 2015-11-19 | Disposition: A | Discharge status: Home / Self Care | Payer: Medicaid Other | Source: Ambulatory Visit | Attending: Student | Admitting: Student

## 2015-11-19 DIAGNOSIS — M7581 Other shoulder lesions, right shoulder: Secondary | ICD-10-CM

## 2015-11-19 DIAGNOSIS — M7521 Bicipital tendinitis, right shoulder: Secondary | ICD-10-CM | POA: Diagnosis present

## 2015-11-19 DIAGNOSIS — M25411 Effusion, right shoulder: Secondary | ICD-10-CM | POA: Insufficient documentation

## 2015-11-19 DIAGNOSIS — M13811 Other specified arthritis, right shoulder: Secondary | ICD-10-CM | POA: Insufficient documentation

## 2015-11-19 DIAGNOSIS — M7551 Bursitis of right shoulder: Secondary | ICD-10-CM | POA: Insufficient documentation

## 2016-03-07 ENCOUNTER — Telehealth: Payer: Self-pay | Admitting: Internal Medicine

## 2016-03-07 NOTE — Telephone Encounter (Signed)
Recall for tcs °

## 2016-07-28 ENCOUNTER — Emergency Department (HOSPITAL_COMMUNITY): Payer: Medicaid Other

## 2016-07-28 ENCOUNTER — Encounter (HOSPITAL_COMMUNITY): Payer: Self-pay | Admitting: Cardiology

## 2016-07-28 ENCOUNTER — Emergency Department (HOSPITAL_COMMUNITY)
Admission: EM | Admit: 2016-07-28 | Discharge: 2016-07-28 | Disposition: A | Discharge status: Home / Self Care | Payer: Medicaid Other | Attending: Emergency Medicine | Admitting: Emergency Medicine

## 2016-07-28 DIAGNOSIS — Y929 Unspecified place or not applicable: Secondary | ICD-10-CM | POA: Insufficient documentation

## 2016-07-28 DIAGNOSIS — M25562 Pain in left knee: Secondary | ICD-10-CM | POA: Diagnosis not present

## 2016-07-28 DIAGNOSIS — S299XXA Unspecified injury of thorax, initial encounter: Secondary | ICD-10-CM | POA: Diagnosis present

## 2016-07-28 DIAGNOSIS — S2242XA Multiple fractures of ribs, left side, initial encounter for closed fracture: Secondary | ICD-10-CM | POA: Diagnosis not present

## 2016-07-28 DIAGNOSIS — R1032 Left lower quadrant pain: Secondary | ICD-10-CM | POA: Insufficient documentation

## 2016-07-28 DIAGNOSIS — J45909 Unspecified asthma, uncomplicated: Secondary | ICD-10-CM | POA: Diagnosis not present

## 2016-07-28 DIAGNOSIS — S5011XA Contusion of right forearm, initial encounter: Secondary | ICD-10-CM | POA: Diagnosis not present

## 2016-07-28 DIAGNOSIS — Y9389 Activity, other specified: Secondary | ICD-10-CM | POA: Insufficient documentation

## 2016-07-28 DIAGNOSIS — Y999 Unspecified external cause status: Secondary | ICD-10-CM | POA: Diagnosis not present

## 2016-07-28 DIAGNOSIS — R42 Dizziness and giddiness: Secondary | ICD-10-CM | POA: Insufficient documentation

## 2016-07-28 DIAGNOSIS — Z79899 Other long term (current) drug therapy: Secondary | ICD-10-CM | POA: Insufficient documentation

## 2016-07-28 DIAGNOSIS — R110 Nausea: Secondary | ICD-10-CM | POA: Insufficient documentation

## 2016-07-28 DIAGNOSIS — Z7982 Long term (current) use of aspirin: Secondary | ICD-10-CM | POA: Diagnosis not present

## 2016-07-28 DIAGNOSIS — J449 Chronic obstructive pulmonary disease, unspecified: Secondary | ICD-10-CM | POA: Insufficient documentation

## 2016-07-28 DIAGNOSIS — M79601 Pain in right arm: Secondary | ICD-10-CM

## 2016-07-28 HISTORY — DX: Unspecified asthma, uncomplicated: J45.909

## 2016-07-28 LAB — CBC WITH DIFFERENTIAL/PLATELET
Basophils Absolute: 0 10*3/uL (ref 0.0–0.1)
Basophils Relative: 1 %
Eosinophils Absolute: 0.1 10*3/uL (ref 0.0–0.7)
Eosinophils Relative: 2 %
HCT: 42.1 % (ref 36.0–46.0)
Hemoglobin: 14.4 g/dL (ref 12.0–15.0)
Lymphocytes Relative: 26 %
Lymphs Abs: 1.3 10*3/uL (ref 0.7–4.0)
MCH: 32.2 pg (ref 26.0–34.0)
MCHC: 34.2 g/dL (ref 30.0–36.0)
MCV: 94.2 fL (ref 78.0–100.0)
Monocytes Absolute: 0.5 10*3/uL (ref 0.1–1.0)
Monocytes Relative: 10 %
Neutro Abs: 3.2 10*3/uL (ref 1.7–7.7)
Neutrophils Relative %: 61 %
Platelets: 227 10*3/uL (ref 150–400)
RBC: 4.47 MIL/uL (ref 3.87–5.11)
RDW: 13 % (ref 11.5–15.5)
WBC: 5.2 10*3/uL (ref 4.0–10.5)

## 2016-07-28 LAB — BASIC METABOLIC PANEL
Anion gap: 8 (ref 5–15)
BUN: 10 mg/dL (ref 6–20)
CO2: 31 mmol/L (ref 22–32)
Calcium: 9.2 mg/dL (ref 8.9–10.3)
Chloride: 101 mmol/L (ref 101–111)
Creatinine, Ser: 0.94 mg/dL (ref 0.44–1.00)
GFR calc Af Amer: >60 mL/min (ref >60)
GFR calc non Af Amer: >60 mL/min (ref >60)
Glucose, Bld: 103 mg/dL — ABNORMAL HIGH (ref 65–99)
Potassium: 3.6 mmol/L (ref 3.5–5.1)
Sodium: 140 mmol/L (ref 135–145)

## 2016-07-28 LAB — TROPONIN I: Troponin I: <0.03 ng/mL (ref <0.03)

## 2016-07-28 MED ORDER — IBUPROFEN 600 MG PO TABS: 600.0000 mg | ORAL_TABLET | Freq: Four times a day (QID) | ORAL | 0 refills | Status: DC | PRN

## 2016-07-28 MED ORDER — ONDANSETRON HCL 4 MG/2ML IJ SOLN
4.0000 mg | Freq: Once | INTRAMUSCULAR | Status: AC
  Administered 2016-07-28: 4 mg via INTRAVENOUS
  Filled 2016-07-28: qty 2

## 2016-07-28 MED ORDER — HYDROCODONE-ACETAMINOPHEN 5-325 MG PO TABS
2.0000 | ORAL_TABLET | Freq: Once | ORAL | Status: AC
  Administered 2016-07-28: 2 via ORAL
  Filled 2016-07-28: qty 2

## 2016-07-28 MED ORDER — HYDROCODONE-ACETAMINOPHEN 5-325 MG PO TABS: 1.0000 | ORAL_TABLET | Freq: Four times a day (QID) | ORAL | 0 refills | Status: DC | PRN

## 2016-07-28 MED ORDER — KETOROLAC TROMETHAMINE 30 MG/ML IJ SOLN
30.0000 mg | Freq: Once | INTRAMUSCULAR | Status: AC
  Administered 2016-07-28: 30 mg via INTRAVENOUS
  Filled 2016-07-28: qty 1

## 2016-07-28 MED ORDER — IOPAMIDOL (ISOVUE-300) INJECTION 61%
75.0000 mL | Freq: Once | INTRAVENOUS | Status: AC | PRN
  Administered 2016-07-28: 75 mL via INTRAVENOUS
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
## 2016-07-28 NOTE — ED Notes (Signed)
Pt verbalized that she is going to [employer] office following discharge.  Pt also told not to take any  More norco until 8pm tonight.
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
## 2016-07-28 NOTE — Discharge Instructions (Signed)
Medications: Norco, ibuprofen  Treatment: Take 1-2 Norco every 4-6 hours as needed for severe pain. Take ibuprofen every 6 hours as needed for mild to moderate pain. Use ice on your areas of pain 3-4 times daily alternating 20 minutes on, 20 minutes off. Wear your knee brace for support at all times. Keep elevated when you're not walking on it.  Follow-up: Please follow-up with your primary care provider on Monday for further evaluation and treatment of your symptoms. Please follow-up with orthopedic doctor, Dr. Ninfa Linden, for further evaluation and treatment of your knee pain.
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
## 2016-07-28 NOTE — ED Notes (Signed)
Pt made aware to return if symptoms worsen or if any life threatening symptoms occur.   

## 2016-07-28 NOTE — ED Notes (Signed)
Pt told to go to [facility] following today's visit.  Pt verbalized understanding.

## 2016-10-11 ENCOUNTER — Encounter (INDEPENDENT_AMBULATORY_CARE_PROVIDER_SITE_OTHER): Payer: Self-pay | Admitting: Internal Medicine

## 2016-10-11 ENCOUNTER — Encounter (INDEPENDENT_AMBULATORY_CARE_PROVIDER_SITE_OTHER): Payer: Self-pay

## 2016-10-31 ENCOUNTER — Encounter (INDEPENDENT_AMBULATORY_CARE_PROVIDER_SITE_OTHER): Payer: Self-pay | Admitting: *Deleted

## 2016-10-31 ENCOUNTER — Encounter (INDEPENDENT_AMBULATORY_CARE_PROVIDER_SITE_OTHER): Payer: Self-pay | Admitting: Internal Medicine

## 2016-10-31 ENCOUNTER — Ambulatory Visit (INDEPENDENT_AMBULATORY_CARE_PROVIDER_SITE_OTHER): Payer: Medicaid Other | Admitting: Internal Medicine

## 2016-10-31 ENCOUNTER — Other Ambulatory Visit (INDEPENDENT_AMBULATORY_CARE_PROVIDER_SITE_OTHER): Payer: Self-pay | Admitting: Internal Medicine

## 2016-10-31 ENCOUNTER — Telehealth (INDEPENDENT_AMBULATORY_CARE_PROVIDER_SITE_OTHER): Payer: Self-pay | Admitting: *Deleted

## 2016-10-31 VITALS — BP 128/80 | HR 76 | Temp 98.4°F | Ht 63.0 in | Wt 112.3 lb

## 2016-10-31 DIAGNOSIS — R195 Other fecal abnormalities: Secondary | ICD-10-CM | POA: Insufficient documentation

## 2016-10-31 LAB — CBC WITH DIFFERENTIAL/PLATELET
Basophils Absolute: 62 cells/uL (ref 0–200)
Basophils Relative: 1 %
Eosinophils Absolute: 124 cells/uL (ref 15–500)
Eosinophils Relative: 2 %
HCT: 37.7 % (ref 35.0–45.0)
Hemoglobin: 13 g/dL (ref 11.7–15.5)
Lymphocytes Relative: 37 %
Lymphs Abs: 2294 cells/uL (ref 850–3900)
MCH: 31.7 pg (ref 27.0–33.0)
MCHC: 34.5 g/dL (ref 32.0–36.0)
MCV: 92 fL (ref 80.0–100.0)
MPV: 10.4 fL (ref 7.5–12.5)
Monocytes Absolute: 558 cells/uL (ref 200–950)
Monocytes Relative: 9 %
Neutro Abs: 3162 cells/uL (ref 1500–7800)
Neutrophils Relative %: 51 %
Platelets: 243 10*3/uL (ref 140–400)
RBC: 4.1 MIL/uL (ref 3.80–5.10)
RDW: 13.1 % (ref 11.0–15.0)
WBC: 6.2 10*3/uL (ref 3.8–10.8)

## 2016-10-31 MED ORDER — PEG 3350-KCL-NA BICARB-NACL 420 G PO SOLR: 4000.0000 mL | Freq: Once | ORAL | 0 refills | Status: AC

## 2016-10-31 NOTE — Patient Instructions (Signed)
The risks of bleeding, perforation and infection were reviewed with patient.  

## 2016-10-31 NOTE — Telephone Encounter (Signed)
Patient needs trilyte 
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
## 2016-11-16 ENCOUNTER — Encounter (INDEPENDENT_AMBULATORY_CARE_PROVIDER_SITE_OTHER): Payer: Self-pay | Admitting: *Deleted

## 2016-12-02 ENCOUNTER — Encounter (HOSPITAL_COMMUNITY)
Admission: RE | Disposition: A | Discharge status: Home / Self Care | Payer: Self-pay | Source: Ambulatory Visit | Attending: Internal Medicine

## 2016-12-02 ENCOUNTER — Ambulatory Visit (HOSPITAL_COMMUNITY)
Admission: RE | Admit: 2016-12-02 | Discharge: 2016-12-02 | Disposition: A | Discharge status: Home / Self Care | Payer: Medicaid Other | Source: Ambulatory Visit | Attending: Internal Medicine | Admitting: Internal Medicine

## 2016-12-02 ENCOUNTER — Encounter (HOSPITAL_COMMUNITY): Payer: Self-pay

## 2016-12-02 DIAGNOSIS — R195 Other fecal abnormalities: Secondary | ICD-10-CM | POA: Insufficient documentation

## 2016-12-02 DIAGNOSIS — F329 Major depressive disorder, single episode, unspecified: Secondary | ICD-10-CM | POA: Insufficient documentation

## 2016-12-02 DIAGNOSIS — J449 Chronic obstructive pulmonary disease, unspecified: Secondary | ICD-10-CM | POA: Insufficient documentation

## 2016-12-02 DIAGNOSIS — F419 Anxiety disorder, unspecified: Secondary | ICD-10-CM | POA: Diagnosis not present

## 2016-12-02 DIAGNOSIS — K644 Residual hemorrhoidal skin tags: Secondary | ICD-10-CM | POA: Diagnosis not present

## 2016-12-02 DIAGNOSIS — Z79899 Other long term (current) drug therapy: Secondary | ICD-10-CM | POA: Insufficient documentation

## 2016-12-02 HISTORY — PX: COLONOSCOPY: SHX5424

## 2016-12-02 SURGERY — COLONOSCOPY
Anesthesia: Moderate Sedation

## 2016-12-02 MED ORDER — MEPERIDINE HCL 50 MG/ML IJ SOLN
INTRAMUSCULAR | Status: AC
  Filled 2016-12-02: qty 1

## 2016-12-02 MED ORDER — SODIUM CHLORIDE 0.9 % IV SOLN
INTRAVENOUS | Status: DC
  Administered 2016-12-02: 10:00:00 via INTRAVENOUS

## 2016-12-02 MED ORDER — MEPERIDINE HCL 50 MG/ML IJ SOLN
INTRAMUSCULAR | Status: DC | PRN
  Administered 2016-12-02 (×4): 25 mg via INTRAVENOUS

## 2016-12-02 MED ORDER — MIDAZOLAM HCL 5 MG/5ML IJ SOLN
INTRAMUSCULAR | Status: DC | PRN
  Administered 2016-12-02 (×5): 2 mg via INTRAVENOUS
  Administered 2016-12-02: 3 mg via INTRAVENOUS
  Administered 2016-12-02: 2 mg via INTRAVENOUS

## 2016-12-02 MED ORDER — MIDAZOLAM HCL 5 MG/5ML IJ SOLN
INTRAMUSCULAR | Status: AC
  Filled 2016-12-02: qty 10

## 2016-12-02 MED ORDER — MIDAZOLAM HCL 5 MG/5ML IJ SOLN
INTRAMUSCULAR | Status: AC
  Filled 2016-12-02: qty 5

## 2016-12-02 NOTE — Discharge Instructions (Signed)
Resume usual medications and diet. No driving for 24 hours. Next screening colonoscopy in 10 years.    Colonoscopy, Adult, Care After This sheet gives you information about how to care for yourself after your procedure. Your doctor may also give you more specific instructions. If you have problems or questions, call your doctor. Follow these instructions at home: General instructions   For the first 24 hours after the procedure: ? Do not drive or use machinery. ? Do not sign important documents. ? Do not drink alcohol. ? Do your daily activities more slowly than normal. ? Eat foods that are soft and easy to digest. ? Rest often.  Take over-the-counter or prescription medicines only as told by your doctor.  It is up to you to get the results of your procedure. Ask your doctor, or the department performing the procedure, when your results will be ready. To help cramping and bloating:  Try walking around.  Put heat on your belly (abdomen) as told by your doctor. Use a heat source that your doctor recommends, such as a moist heat pack or a heating pad. ? Put a towel between your skin and the heat source. ? Leave the heat on for 20-30 minutes. ? Remove the heat if your skin turns bright red. This is especially important if you cannot feel pain, heat, or cold. You can get burned. Eating and drinking  Drink enough fluid to keep your pee (urine) clear or pale yellow.  Return to your normal diet as told by your doctor. Avoid heavy or fried foods that are hard to digest.  Avoid drinking alcohol for as long as told by your doctor. Contact a doctor if:  You have blood in your poop (stool) 2-3 days after the procedure. Get help right away if:  You have more than a small amount of blood in your poop.  You see large clumps of tissue (blood clots) in your poop.  Your belly is swollen.  You feel sick to your stomach (nauseous).  You throw up (vomit).  You have a fever.  You have  belly pain that gets worse, and medicine does not help your pain. This information is not intended to replace advice given to you by your health care provider. Make sure you discuss any questions you have with your health care provider. Document Released: 06/11/2010 Document Revised: 02/01/2016 Document Reviewed: 02/01/2016 Elsevier Interactive Patient Education  2017 [employer]    Hemorrhoids Hemorrhoids are swollen veins in and around the rectum or anus. There are two types of hemorrhoids:  Internal hemorrhoids. These occur in the veins that are just inside the rectum. They may poke through to the outside and become irritated and painful.  External hemorrhoids. These occur in the veins that are outside of the anus and can be felt as a painful swelling or hard lump near the anus.  Most hemorrhoids do not cause serious problems, and they can be managed with home treatments such as diet and lifestyle changes. If home treatments do not help your symptoms, procedures can be done to shrink or remove the hemorrhoids. What are the causes? This condition is caused by increased pressure in the anal area. This pressure may result from various things, including:  Constipation.  Straining to have a bowel movement.  Diarrhea.  Pregnancy.  Obesity.  Sitting for long periods of time.  Heavy lifting or other activity that causes you to strain.  Anal sex.  What are the signs or symptoms? Symptoms of  this condition include:  Pain.  Anal itching or irritation.  Rectal bleeding.  Leakage of stool (feces).  Anal swelling.  One or more lumps around the anus.  How is this diagnosed? This condition can often be diagnosed through a visual exam. Other exams or tests may also be done, such as:  Examination of the rectal area with a gloved hand (digital rectal exam).  Examination of the anal canal using a small tube (anoscope).  A blood test, if you have lost a significant amount of  blood.  A test to look inside the colon (sigmoidoscopy or colonoscopy).  How is this treated? This condition can usually be treated at home. However, various procedures may be done if dietary changes, lifestyle changes, and other home treatments do not help your symptoms. These procedures can help make the hemorrhoids smaller or remove them completely. Some of these procedures involve surgery, and others do not. Common procedures include:  Rubber band ligation. Rubber bands are placed at the base of the hemorrhoids to cut off the blood supply to them.  Sclerotherapy. Medicine is injected into the hemorrhoids to shrink them.  Infrared coagulation. A type of light energy is used to get rid of the hemorrhoids.  Hemorrhoidectomy surgery. The hemorrhoids are surgically removed, and the veins that supply them are tied off.  Stapled hemorrhoidopexy surgery. A circular stapling device is used to remove the hemorrhoids and use staples to cut off the blood supply to them.  Follow these instructions at home: Eating and drinking  Eat foods that have a lot of fiber in them, such as whole grains, beans, nuts, fruits, and vegetables. Ask your health care provider about taking products that have added fiber (fiber supplements).  Drink enough fluid to keep your urine clear or pale yellow. Managing pain and swelling  Take warm sitz baths for 20 minutes, 3-4 times a day to ease pain and discomfort.  If directed, apply ice to the affected area. Using ice packs between sitz baths may be helpful. ? Put ice in a plastic bag. ? Place a towel between your skin and the bag. ? Leave the ice on for 20 minutes, 2-3 times a day. General instructions  Take over-the-counter and prescription medicines only as told by your health care provider.  Use medicated creams or suppositories as told.  Exercise regularly.  Go to the bathroom when you have the urge to have a bowel movement. Do not wait.  Avoid straining  to have bowel movements.  Keep the anal area dry and clean. Use wet toilet paper or moist towelettes after a bowel movement.  Do not sit on the toilet for long periods of time. This increases blood pooling and pain. Contact a health care provider if:  You have increasing pain and swelling that are not controlled by treatment or medicine.  You have uncontrolled bleeding.  You have difficulty having a bowel movement, or you are unable to have a bowel movement.  You have pain or inflammation outside the area of the hemorrhoids. This information is not intended to replace advice given to you by your health care provider. Make sure you discuss any questions you have with your health care provider. Document Released: 05/06/2000 Document Revised: 10/07/2015 Document Reviewed: 01/21/2015 Elsevier Interactive Patient Education  2017 [employer].
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
## 2016-12-02 NOTE — Op Note (Signed)
[facility] Patient Name: Catherine [name] Procedure Date: 12/02/2016 10:35 AM MRN: [id number] Date of Birth: January 14, 1956 Attending MD: Hildred Laser , MD CSN: [id number] Age: 61 Admit Type: Outpatient Procedure:                Colonoscopy Indications:              Heme positive stool Providers:                Hildred Laser, MD, Janeece Riggers, RN, Rosina Lowenstein, RN Referring MD:             Vidal Schwalbe, MD Medicines:                Meperidine 100 mg IV, Midazolam 15 mg IV Complications:            No immediate complications. Estimated Blood Loss:     Estimated blood loss: none. Procedure:                Pre-Anesthesia Assessment:                           - Prior to the procedure, a History and Physical                            was performed, and patient medications and                            allergies were reviewed. The patient's tolerance of                            previous anesthesia was also reviewed. The risks                            and benefits of the procedure and the sedation                            options and risks were discussed with the patient.                            All questions were answered, and informed consent                            was obtained. Prior Anticoagulants: The patient has                            taken no previous anticoagulant or antiplatelet                            agents. ASA Grade Assessment: II - A patient with                            mild systemic disease. After reviewing the risks                            and benefits, the patient was deemed in  satisfactory condition to undergo the procedure.                           After obtaining informed consent, the colonoscope                            was passed under direct vision. Throughout the                            procedure, the patient's blood pressure, pulse, and                            oxygen saturations were monitored  continuously. The                            [id number] ([id number]) was introduced through the                            anus and advanced to the the cecum, identified by                            appendiceal orifice and ileocecal valve. The                            colonoscopy was performed without difficulty. The                            ileocecal valve, appendiceal orifice, and rectum                            were photographed. The quality of the bowel                            preparation was adequate to identify polyps 6 mm                            and larger in size. Scope In: 11:37:36 AM Scope Out: 11:53:46 AM Scope Withdrawal Time: 0 hours 6 minutes 55 seconds  Total Procedure Duration: 0 hours 16 minutes 10 seconds  Findings:      The perianal and digital rectal examinations were normal.      The colon (entire examined portion) appeared normal.      External hemorrhoids were found during retroflexion. The hemorrhoids       were small. Impression:               - The entire examined colon is normal.                           - External hemorrhoids.                           - No specimens collected. Moderate Sedation:      Moderate (conscious) sedation was administered by the endoscopy nurse       and supervised by the endoscopist. The following parameters were  monitored: oxygen saturation, heart rate, blood pressure, CO2       capnography and response to care. Total physician intraservice time was       30 minutes. Recommendation:           - Patient has a contact number available for                            emergencies. The signs and symptoms of potential                            delayed complications were discussed with the                            patient. Return to normal activities tomorrow.                            Written discharge instructions were provided to the                            patient.                           - Resume previous  diet today.                           - Continue present medications.                           - Repeat colonoscopy in 10 years for screening                            purposes.                           Comment: Pictures of ileocecal valve and                            appendiceal orifice were taken but lost because of                            footprint malfunction. Procedure Code(s):        --- Professional ---                           [phone], Colonoscopy, flexible; diagnostic, including                            collection of specimen(s) by brushing or washing,                            when performed (separate procedure)                           99152, Moderate sedation services provided by the  same physician or other qualified health care                            professional performing the diagnostic or                            therapeutic service that the sedation supports,                            requiring the presence of an independent trained                            observer to assist in the monitoring of the                            patient's level of consciousness and physiological                            status; initial 15 minutes of intraservice time,                            patient age 61 years or older                           [phone], Moderate sedation services; each additional                            15 minutes intraservice time Diagnosis Code(s):        --- Professional ---                           K64.4, Residual hemorrhoidal skin tags                           R19.5, Other fecal abnormalities CPT copyright 2016 [employer]. All rights reserved. The codes documented in this report are preliminary and upon coder review may  be revised to meet current compliance requirements. Hildred Laser, MD Hildred Laser, MD 12/02/2016 12:00:00 PM This report has been signed electronically. Number of Addenda:  0

## 2016-12-06 ENCOUNTER — Encounter (HOSPITAL_COMMUNITY): Payer: Self-pay | Admitting: Internal Medicine

## 2016-12-26 ENCOUNTER — Ambulatory Visit: Payer: Medicaid Other | Attending: Nurse Practitioner | Admitting: Nurse Practitioner

## 2016-12-26 ENCOUNTER — Encounter: Payer: Self-pay | Admitting: Nurse Practitioner

## 2016-12-26 ENCOUNTER — Ambulatory Visit
Admission: RE | Admit: 2016-12-26 | Discharge: 2016-12-26 | Disposition: A | Discharge status: Home / Self Care | Payer: Medicaid Other | Source: Ambulatory Visit | Attending: Nurse Practitioner | Admitting: Nurse Practitioner

## 2016-12-26 VITALS — BP 124/77 | HR 77 | Temp 98.2°F | Resp 14 | Ht 63.0 in | Wt 110.0 lb

## 2016-12-26 DIAGNOSIS — M79605 Pain in left leg: Secondary | ICD-10-CM

## 2016-12-26 DIAGNOSIS — M545 Low back pain, unspecified: Secondary | ICD-10-CM | POA: Insufficient documentation

## 2016-12-26 DIAGNOSIS — Z87891 Personal history of nicotine dependence: Secondary | ICD-10-CM | POA: Insufficient documentation

## 2016-12-26 DIAGNOSIS — F329 Major depressive disorder, single episode, unspecified: Secondary | ICD-10-CM | POA: Diagnosis not present

## 2016-12-26 DIAGNOSIS — G5600 Carpal tunnel syndrome, unspecified upper limb: Secondary | ICD-10-CM | POA: Insufficient documentation

## 2016-12-26 DIAGNOSIS — J449 Chronic obstructive pulmonary disease, unspecified: Secondary | ICD-10-CM | POA: Diagnosis not present

## 2016-12-26 DIAGNOSIS — M79606 Pain in leg, unspecified: Secondary | ICD-10-CM | POA: Insufficient documentation

## 2016-12-26 DIAGNOSIS — M47896 Other spondylosis, lumbar region: Secondary | ICD-10-CM | POA: Diagnosis not present

## 2016-12-26 DIAGNOSIS — M25562 Pain in left knee: Secondary | ICD-10-CM | POA: Diagnosis present

## 2016-12-26 DIAGNOSIS — M25461 Effusion, right knee: Secondary | ICD-10-CM | POA: Diagnosis not present

## 2016-12-26 DIAGNOSIS — G894 Chronic pain syndrome: Secondary | ICD-10-CM | POA: Diagnosis not present

## 2016-12-26 DIAGNOSIS — M79604 Pain in right leg: Secondary | ICD-10-CM | POA: Diagnosis present

## 2016-12-26 DIAGNOSIS — F1190 Opioid use, unspecified, uncomplicated: Secondary | ICD-10-CM | POA: Insufficient documentation

## 2016-12-26 DIAGNOSIS — M2342 Loose body in knee, left knee: Secondary | ICD-10-CM | POA: Insufficient documentation

## 2016-12-26 DIAGNOSIS — Z90710 Acquired absence of both cervix and uterus: Secondary | ICD-10-CM | POA: Diagnosis not present

## 2016-12-26 DIAGNOSIS — G8929 Other chronic pain: Secondary | ICD-10-CM

## 2016-12-26 DIAGNOSIS — F419 Anxiety disorder, unspecified: Secondary | ICD-10-CM | POA: Diagnosis not present

## 2016-12-26 DIAGNOSIS — Z9889 Other specified postprocedural states: Secondary | ICD-10-CM | POA: Insufficient documentation

## 2016-12-26 DIAGNOSIS — M1712 Unilateral primary osteoarthritis, left knee: Secondary | ICD-10-CM | POA: Diagnosis not present

## 2016-12-26 DIAGNOSIS — Z79899 Other long term (current) drug therapy: Secondary | ICD-10-CM | POA: Insufficient documentation

## 2016-12-26 DIAGNOSIS — Z79891 Long term (current) use of opiate analgesic: Secondary | ICD-10-CM | POA: Diagnosis not present

## 2016-12-26 DIAGNOSIS — M25569 Pain in unspecified knee: Secondary | ICD-10-CM

## 2016-12-26 DIAGNOSIS — Z7982 Long term (current) use of aspirin: Secondary | ICD-10-CM | POA: Diagnosis not present

## 2016-12-26 DIAGNOSIS — M5136 Other intervertebral disc degeneration, lumbar region: Secondary | ICD-10-CM | POA: Insufficient documentation

## 2016-12-26 NOTE — Progress Notes (Signed)
Safety precautions to be maintained throughout the outpatient stay will include: orient to surroundings, keep bed in low position, maintain call bell within reach at all times, provide assistance with transfer out of bed and ambulation.  
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
## 2016-12-26 NOTE — Patient Instructions (Addendum)
____________________________________________________________________________________________  Appointment Policy Summary  It is our goal and responsibility to provide the medical community with assistance in the evaluation and management of patients with chronic pain. Unfortunately our resources are limited. Because we do not have an unlimited amount of time, or available appointments, we are required to closely monitor and manage their use. The following rules exist to maximize their use:  Patient's responsibilities: 1. Punctuality:  At what time should I arrive? You should be physically present in our office 30 minutes before your scheduled appointment. Your scheduled appointment is with your assigned healthcare provider. However, it takes 5-10 minutes to be "checked-in", and another 15 minutes for the nurses to do the admission. If you arrive to our office at the time you were given for your appointment, you will end up being at least 20-25 minutes late to your appointment with the provider. 2. Tardiness:  What happens if I arrive only a few minutes after my scheduled appointment time? You will need to reschedule your appointment. The cutoff is your appointment time. This is why it is so important that you arrive at least 30 minutes before that appointment. If you have an appointment scheduled for 10:00 AM and you arrive at 10:01, you will be required to reschedule your appointment.  3. Plan ahead:  Always assume that you will encounter traffic on your way in. Plan for it. If you are dependent on a driver, make sure they understand these rules and the need to arrive early. 4. Other appointments and responsibilities:  Avoid scheduling any other appointments before or after your pain clinic appointments.  5. Be prepared:  Write down everything that you need to discuss with your healthcare provider and give this information to the admitting nurse. Write down the medications that you will need  refilled. Bring your pills and bottles (even the empty ones), to all of your appointments, except for those where a procedure is scheduled. 6. No children or pets:  Find someone to take care of them. It is not appropriate to bring them in. 7. Scheduling changes:  We request "advanced notification" of any changes or cancellations. 8. Advanced notification:  Defined as a time period of more than 24 hours prior to the originally scheduled appointment. This allows for the appointment to be offered to other patients. 9. Rescheduling:  When a visit is rescheduled, it will require the cancellation of the original appointment. For this reason they both fall within the category of "Cancellations".  10. Cancellations:  They require advanced notification. Any cancellation less than 24 hours before the  appointment will be recorded as a "No Show". 11. No Show:  Defined as an unkept appointment where the patient failed to notify or declare to the practice their intention or inability to keep the appointment.  Corrective process for repeat offenders:  1. Tardiness: Three (3) episodes of rescheduling due to late arrivals will be recorded as one (1) "No Show". 2. Cancellation or reschedule: Three (3) cancellations or rescheduling will be recorded as one (1) "No Show". 3. "No Shows": Three (3) "No Shows" within a 12 month period will result in discharge from the practice.  ____________________________________________________________________________________________  ____________________________________________________________________________________________  Pain Scale  Introduction: The pain score used by this practice is the Verbal Numerical Rating Scale (VNRS-11). This is an 11-point scale. It is for adults and children 10 years or older. There are significant differences in how the pain score is reported, used, and applied. Forget everything you learned in the past and  learn this scoring  system.  General Information: The scale should reflect your current level of pain. Unless you are specifically asked for the level of your worst pain, or your average pain. If you are asked for one of these two, then it should be understood that it is over the past 24 hours.  Basic Activities of Daily Living (ADL): Personal hygiene, dressing, eating, transferring, and using restroom.  Instructions: Most patients tend to report their level of pain as a combination of two factors, their physical pain and their psychosocial pain. This last one is also known as "suffering" and it is reflection of how physical pain affects you socially and psychologically. From now on, report them separately. From this point on, when asked to report your pain level, report only your physical pain. Use the following table for reference.  Pain Clinic Pain Levels (0-5/10)  Pain Level Score  Description  No Pain 0   Mild pain 1 Nagging, annoying, but does not interfere with basic activities of daily living (ADL). Patients are able to eat, bathe, get dressed, toileting (being able to get on and off the toilet and perform personal hygiene functions), transfer (move in and out of bed or a chair without assistance), and maintain continence (able to control bladder and bowel functions). Blood pressure and heart rate are unaffected. A normal heart rate for a healthy adult ranges from 60 to 100 bpm (beats per minute).   Mild to moderate pain 2 Noticeable and distracting. Impossible to hide from other people. More frequent flare-ups. Still possible to adapt and function close to normal. It can be very annoying and may have occasional stronger flare-ups. With discipline, patients may get used to it and adapt.   Moderate pain 3 Interferes significantly with activities of daily living (ADL). It becomes difficult to feed, bathe, get dressed, get on and off the toilet or to perform personal hygiene functions. Difficult to get in and out of  bed or a chair without assistance. Very distracting. With effort, it can be ignored when deeply involved in activities.   Moderately severe pain 4 Impossible to ignore for more than a few minutes. With effort, patients may still be able to manage work or participate in some social activities. Very difficult to concentrate. Signs of autonomic nervous system discharge are evident: dilated pupils (mydriasis); mild sweating (diaphoresis); sleep interference. Heart rate becomes elevated (>115 bpm). Diastolic blood pressure (lower number) rises above 100 mmHg. Patients find relief in laying down and not moving.   Severe pain 5 Intense and extremely unpleasant. Associated with frowning face and frequent crying. Pain overwhelms the senses.  Ability to do any activity or maintain social relationships becomes significantly limited. Conversation becomes difficult. Pacing back and forth is common, as getting into a comfortable position is nearly impossible. Pain wakes you up from deep sleep. Physical signs will be obvious: pupillary dilation; increased sweating; goosebumps; brisk reflexes; cold, clammy hands and feet; nausea, vomiting or dry heaves; loss of appetite; significant sleep disturbance with inability to fall asleep or to remain asleep. When persistent, significant weight loss is observed due to the complete loss of appetite and sleep deprivation.  Blood pressure and heart rate becomes significantly elevated. Caution: If elevated blood pressure triggers a pounding headache associated with blurred vision, then the patient should immediately seek attention at an urgent or emergency care unit, as these may be signs of an impending stroke.    Emergency Department Pain Levels (6-10/10)  Emergency Room Pain 6   Severely limiting. Requires emergency care and should not be seen or managed at an outpatient pain management facility. Communication becomes difficult and requires great effort. Assistance to reach the  emergency department may be required. Facial flushing and profuse sweating along with potentially dangerous increases in heart rate and blood pressure will be evident.   Distressing pain 7 Self-care is very difficult. Assistance is required to transport, or use restroom. Assistance to reach the emergency department will be required. Tasks requiring coordination, such as bathing and getting dressed become very difficult.   Disabling pain 8 Self-care is no longer possible. At this level, pain is disabling. The individual is unable to do even the most "basic" activities such as walking, eating, bathing, dressing, transferring to a bed, or toileting. Fine motor skills are lost. It is difficult to think clearly.   Incapacitating pain 9 Pain becomes incapacitating. Thought processing is no longer possible. Difficult to remember your own name. Control of movement and coordination are lost.   The worst pain imaginable 10 At this level, most patients pass out from pain. When this level is reached, collapse of the autonomic nervous system occurs, leading to a sudden drop in blood pressure and heart rate. This in turn results in a temporary and dramatic drop in blood flow to the brain, leading to a loss of consciousness. Fainting is one of the body's self defense mechanisms. Passing out puts the brain in a calmed state and causes it to shut down for a while, in order to begin the healing process.    Summary: 1. Refer to this scale when providing us with your pain level. 2. Be accurate and careful when reporting your pain level. This will help with your care. 3. Over-reporting your pain level will lead to loss of credibility. 4. Even a level of 1/10 means that there is pain and will be treated at our facility. 5. High, inaccurate reporting will be documented as "Symptom Exaggeration", leading to loss of credibility and suspicions of possible secondary gains such as obtaining more narcotics, or wanting to appear  disabled, for fraudulent reasons. 6. Only pain levels of 5 or below will be seen at our facility. 7. Pain levels of 6 and above will be sent to the Emergency Department and the appointment cancelled. ____________________________________________________________________________________________  Pain Management Discharge Instructions  General Discharge Instructions :  If you need to reach your doctor call: Monday-Friday 8:00 am - 4:00 pm at [phone] or toll free [phone].  After clinic hours [phone] to have operator reach doctor.  Bring all of your medication bottles to all your appointments in the pain clinic.  To cancel or reschedule your appointment with Pain Management please remember to call 24 hours in advance to avoid a fee.  Refer to the educational materials which you have been given on: General Risks, I had my Procedure. Discharge Instructions, Post Sedation.  Post Procedure Instructions:  The drugs you were given will stay in your system until tomorrow, so for the next 24 hours you should not drive, make any legal decisions or drink any alcoholic beverages.  You may eat anything you prefer, but it is better to start with liquids then soups and crackers, and gradually work up to solid foods.  Please notify your doctor immediately if you have any unusual bleeding, trouble breathing or pain that is not related to your normal pain.  Depending on the type of procedure that was done, some parts of your body may feel week and/or numb.    This usually clears up by tonight or the next day.  Walk with the use of an assistive device or accompanied by an adult for the 24 hours.  You may use ice on the affected area for the first 24 hours.  Put ice in a Ziploc bag and cover with a towel and place against area 15 minutes on 15 minutes off.  You may switch to heat after 24 hours.Pain Management Discharge Instructions  General Discharge Instructions :  If you need to reach your  doctor call: Monday-Friday 8:00 am - 4:00 pm at [phone] or toll free [phone].  After clinic hours [phone] to have operator reach doctor.  Bring all of your medication bottles to all your appointments in the pain clinic.  To cancel or reschedule your appointment with Pain Management please remember to call 24 hours in advance to avoid a fee.  Refer to the educational materials which you have been given on: General Risks, I had my Procedure. Discharge Instructions, Post Sedation.  Post Procedure Instructions:  The drugs you were given will stay in your system until tomorrow, so for the next 24 hours you should not drive, make any legal decisions or drink any alcoholic beverages.  You may eat anything you prefer, but it is better to start with liquids then soups and crackers, and gradually work up to solid foods.  Please notify your doctor immediately if you have any unusual bleeding, trouble breathing or pain that is not related to your normal pain.  Depending on the type of procedure that was done, some parts of your body may feel week and/or numb.  This usually clears up by tonight or the next day.  Walk with the use of an assistive device or accompanied by an adult for the 24 hours.  You may use ice on the affected area for the first 24 hours.  Put ice in a Ziploc bag and cover with a towel and place against area 15 minutes on 15 minutes off.  You may switch to heat after 24 hours. 

## 2016-12-29 LAB — COMPLIANCE DRUG ANALYSIS, UR

## 2017-01-02 LAB — COMPREHENSIVE METABOLIC PANEL
ALK PHOS: 51 IU/L (ref 39–117)
ALT: 19 IU/L (ref 0–32)
AST: 20 IU/L (ref 0–40)
Albumin/Globulin Ratio: 1.7 (ref 1.2–2.2)
Albumin: 4.6 g/dL (ref 3.6–4.8)
BILIRUBIN TOTAL: 0.4 mg/dL (ref 0.0–1.2)
BUN/Creatinine Ratio: 8 — ABNORMAL LOW (ref 12–28)
BUN: 7 mg/dL — ABNORMAL LOW (ref 8–27)
CHLORIDE: 101 mmol/L (ref 96–106)
CO2: 29 mmol/L (ref 20–29)
Calcium: 9.8 mg/dL (ref 8.7–10.3)
Creatinine, Ser: 0.91 mg/dL (ref 0.57–1.00)
GFR calc Af Amer: 79 mL/min/{1.73_m2} (ref >59)
GFR calc non Af Amer: 68 mL/min/{1.73_m2} (ref >59)
GLUCOSE: 87 mg/dL (ref 65–99)
Globulin, Total: 2.7 g/dL (ref 1.5–4.5)
Potassium: 3.9 mmol/L (ref 3.5–5.2)
Sodium: 143 mmol/L (ref 134–144)
Total Protein: 7.3 g/dL (ref 6.0–8.5)

## 2017-01-02 LAB — 25-HYDROXY VITAMIN D LCMS D2+D3
25-Hydroxy, Vitamin D-2: <1 ng/mL
25-Hydroxy, Vitamin D-3: 68 ng/mL
25-Hydroxy, Vitamin D: 68 ng/mL

## 2017-01-02 LAB — C-REACTIVE PROTEIN

## 2017-01-02 LAB — MAGNESIUM: MAGNESIUM: 2 mg/dL (ref 1.6–2.3)

## 2017-01-02 LAB — VITAMIN B12: Vitamin B-12: 666 pg/mL (ref 232–1245)

## 2017-01-02 LAB — SEDIMENTATION RATE: SED RATE: 2 mm/h (ref 0–40)

## 2017-01-03 NOTE — Progress Notes (Signed)
Results were reviewed and found to be: abnormal  No acute injury or pathology identified  Review would suggest interventional pain management techniques may be of benefit

## 2017-01-03 NOTE — Progress Notes (Signed)
Results were reviewed and found to be: mildly abnormal  No acute injury or pathology identified  Review would suggest interventional pain management techniques may be of benefit 

## 2017-03-15 ENCOUNTER — Other Ambulatory Visit (HOSPITAL_COMMUNITY): Payer: Self-pay | Admitting: Respiratory Therapy

## 2017-03-15 DIAGNOSIS — J441 Chronic obstructive pulmonary disease with (acute) exacerbation: Secondary | ICD-10-CM

## 2017-03-22 ENCOUNTER — Other Ambulatory Visit (HOSPITAL_COMMUNITY): Payer: Self-pay | Admitting: Pulmonary Disease

## 2017-03-22 ENCOUNTER — Ambulatory Visit (HOSPITAL_COMMUNITY)
Admission: RE | Admit: 2017-03-22 | Discharge: 2017-03-22 | Disposition: A | Discharge status: Home / Self Care | Payer: Medicaid Other | Source: Ambulatory Visit | Attending: Pulmonary Disease | Admitting: Pulmonary Disease

## 2017-03-22 DIAGNOSIS — J441 Chronic obstructive pulmonary disease with (acute) exacerbation: Secondary | ICD-10-CM | POA: Diagnosis present

## 2017-03-22 DIAGNOSIS — J449 Chronic obstructive pulmonary disease, unspecified: Secondary | ICD-10-CM

## 2017-03-22 MED ORDER — ALBUTEROL SULFATE (2.5 MG/3ML) 0.083% IN NEBU
2.5000 mg | INHALATION_SOLUTION | Freq: Once | RESPIRATORY_TRACT | Status: AC
  Administered 2017-03-22: 2.5 mg via RESPIRATORY_TRACT

## 2017-04-04 LAB — PULMONARY FUNCTION TEST
DL/VA % PRED: 93 %
DL/VA: 4.23 ml/min/mmHg/L
DLCO COR % PRED: 106 %
DLCO cor: 22.96 ml/min/mmHg
DLCO unc % pred: 106 %
DLCO unc: 22.96 ml/min/mmHg
FEF 25-75 PRE: 2.03 L/s
FEF 25-75 Post: 2.87 L/sec
FEF2575-%CHANGE-POST: 41 %
FEF2575-%PRED-POST: 133 %
FEF2575-%Pred-Pre: 94 %
FEV1-%CHANGE-POST: 15 %
FEV1-%PRED-POST: 113 %
FEV1-%Pred-Pre: 97 %
FEV1-Post: 2.63 L
FEV1-Pre: 2.27 L
FEV1FVC-%Change-Post: 13 %
FEV1FVC-%PRED-PRE: 93 %
FEV6-%Change-Post: 5 %
FEV6-%PRED-POST: 109 %
FEV6-%Pred-Pre: 103 %
FEV6-POST: 3.17 L
FEV6-PRE: 3.01 L
FEV6FVC-%PRED-POST: 104 %
FEV6FVC-%PRED-PRE: 104 %
FVC-%CHANGE-POST: 1 %
FVC-%PRED-POST: 105 %
FVC-%PRED-PRE: 103 %
FVC-POST: 3.17 L
FVC-PRE: 3.11 L
PRE FEV6/FVC RATIO: 100 %
Post FEV1/FVC ratio: 83 %
Post FEV6/FVC ratio: 100 %
Pre FEV1/FVC ratio: 73 %
RV % PRED: 279 %
RV: 5.4 L
TLC % pred: 169 %
TLC: 8.06 L

## 2017-09-01 ENCOUNTER — Other Ambulatory Visit: Payer: Self-pay | Admitting: Internal Medicine

## 2017-09-01 DIAGNOSIS — B1920 Unspecified viral hepatitis C without hepatic coma: Secondary | ICD-10-CM

## 2017-09-05 IMAGING — DX DG FOOT COMPLETE 3+V*L*
3 series · 3 of 3 positions shown · non-contrast
Comparison: 05/06/2012

CLINICAL DATA: Bilateral leg swelling starting this morning,
bruising of toes

EXAM:
LEFT FOOT - COMPLETE 3+ VIEW

[foot ap]
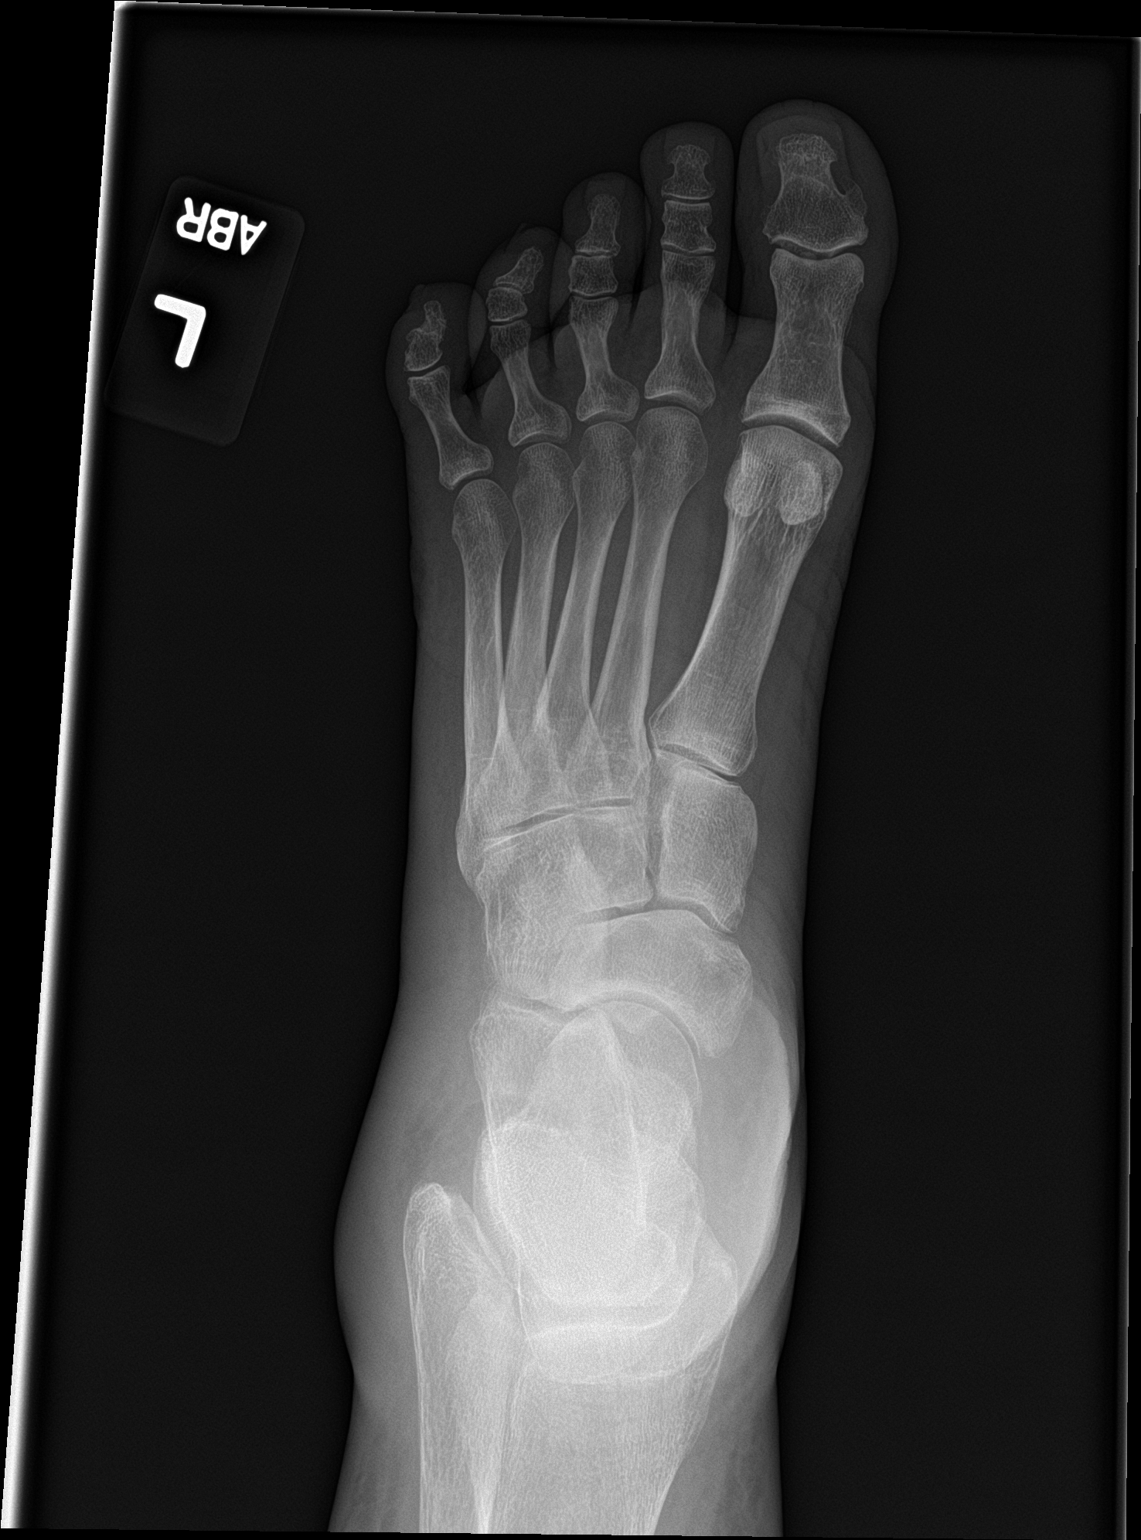

[foot obl]
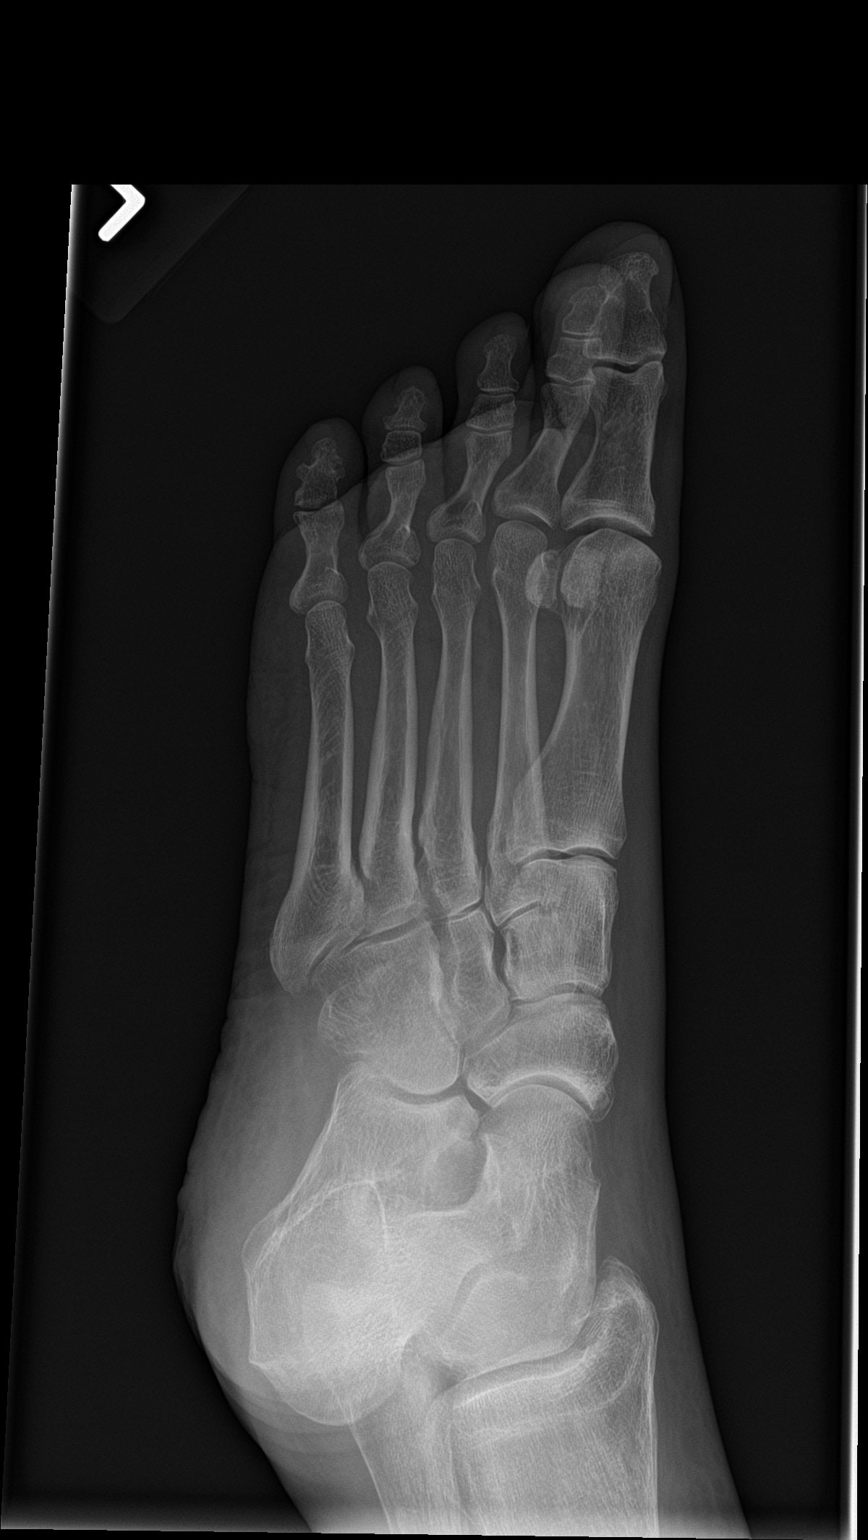

[foot lat]
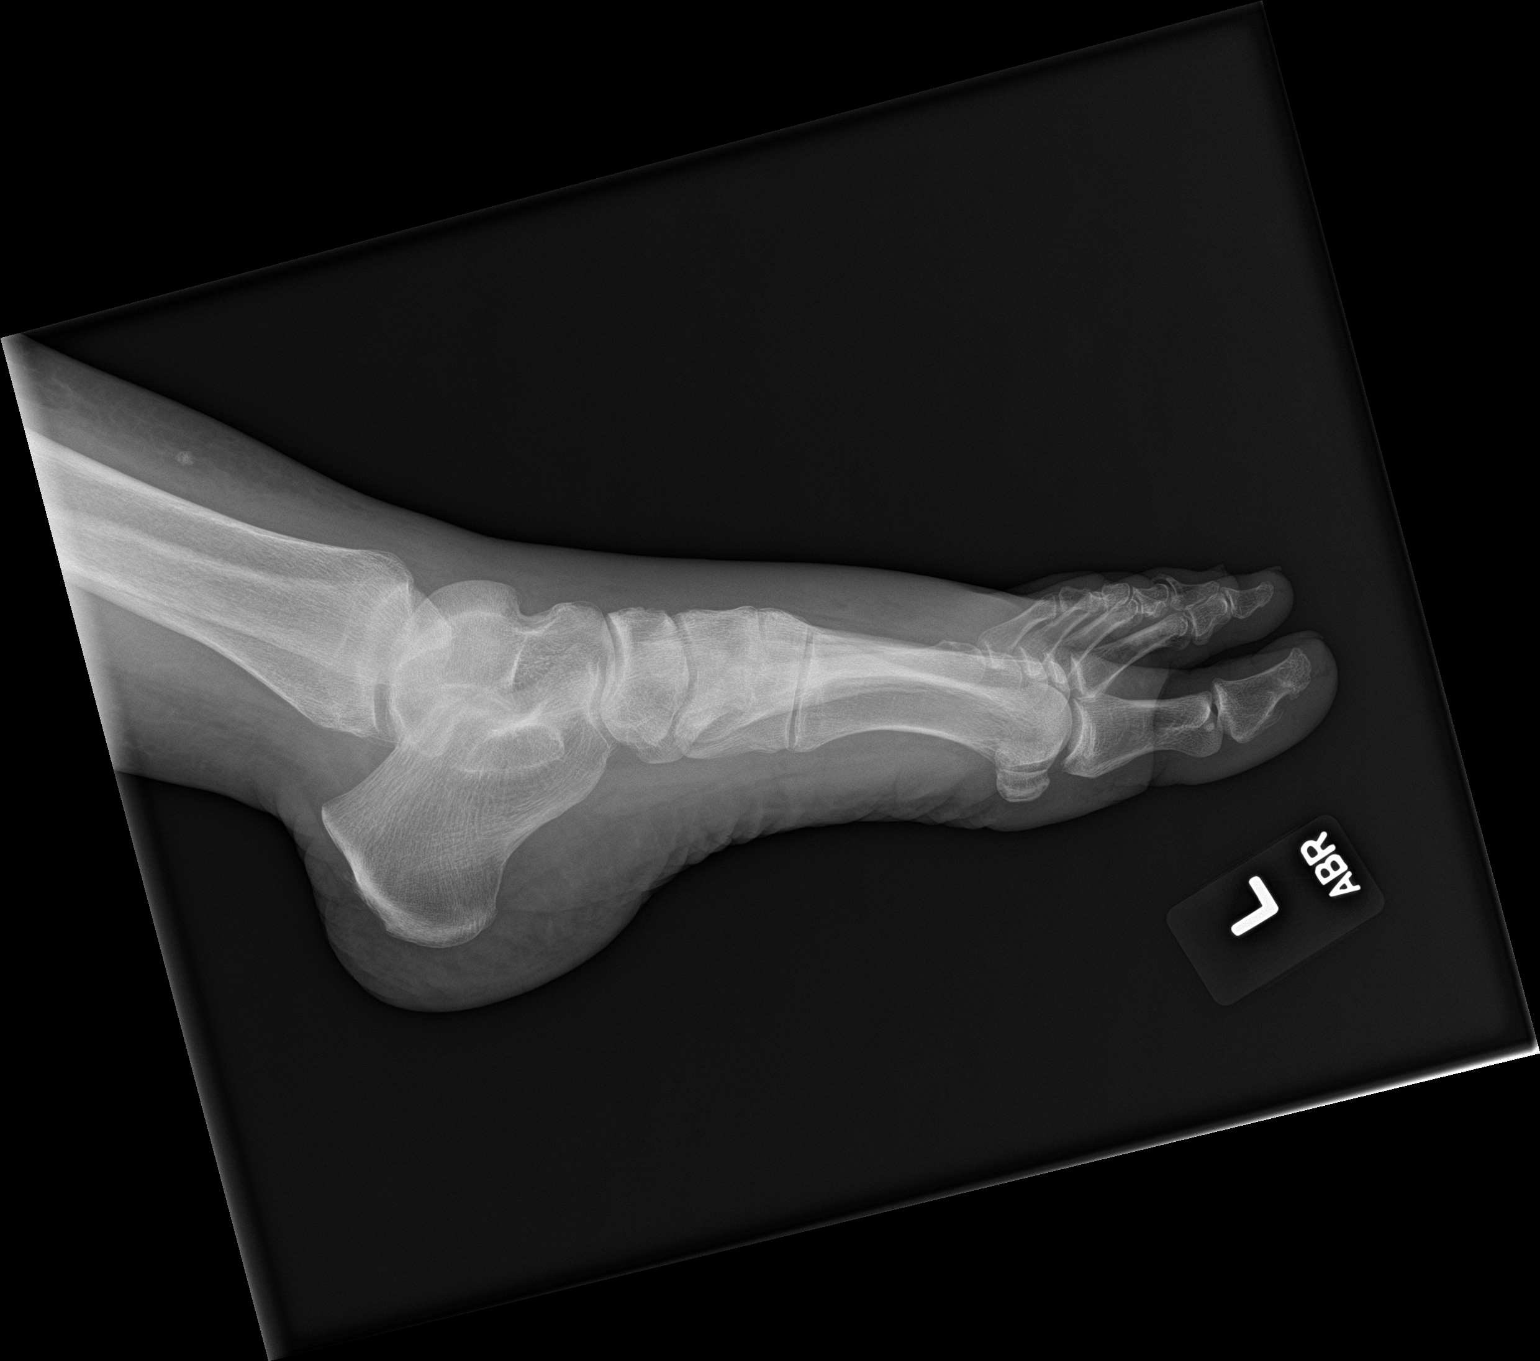

[3 of 3 positions shown; findings below may reference images not displayed]

FINDINGS: Three views of the left foot submitted. No acute fracture or
subluxation. No radiopaque foreign body. There is soft tissue
swelling dorsal metatarsal
IMPRESSION: No acute fracture or subluxation. Soft tissue swelling dorsal
metatarsal region.

## 2017-09-05 IMAGING — DX DG CHEST 2V
2 series · 2 of 2 positions shown · non-contrast
Comparison: 05/06/2012, 05/03/2010

CLINICAL DATA: 59-year-old female with a history of bilateral leg
swelling

EXAM:
CHEST  2 VIEW

[chest pa]
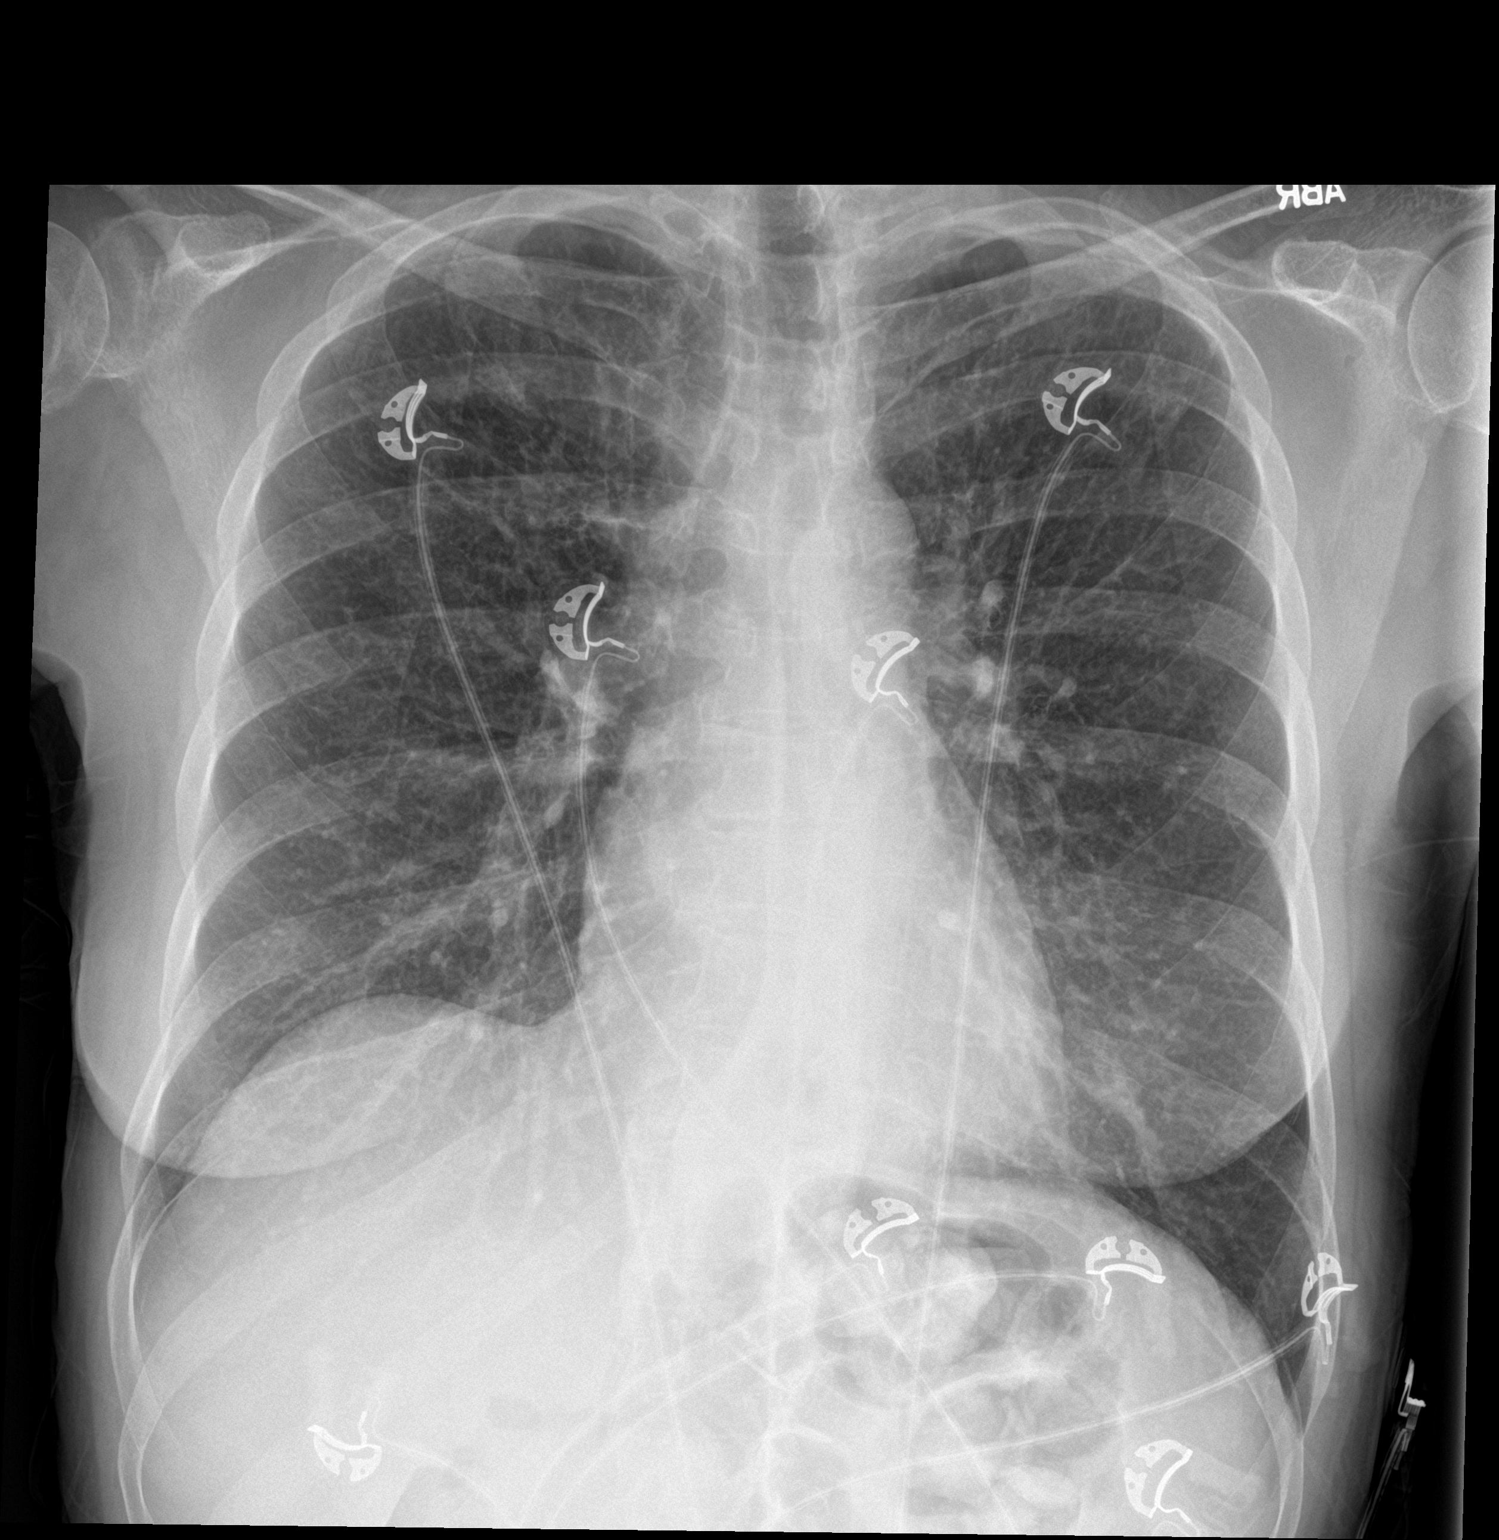

[chest lat]
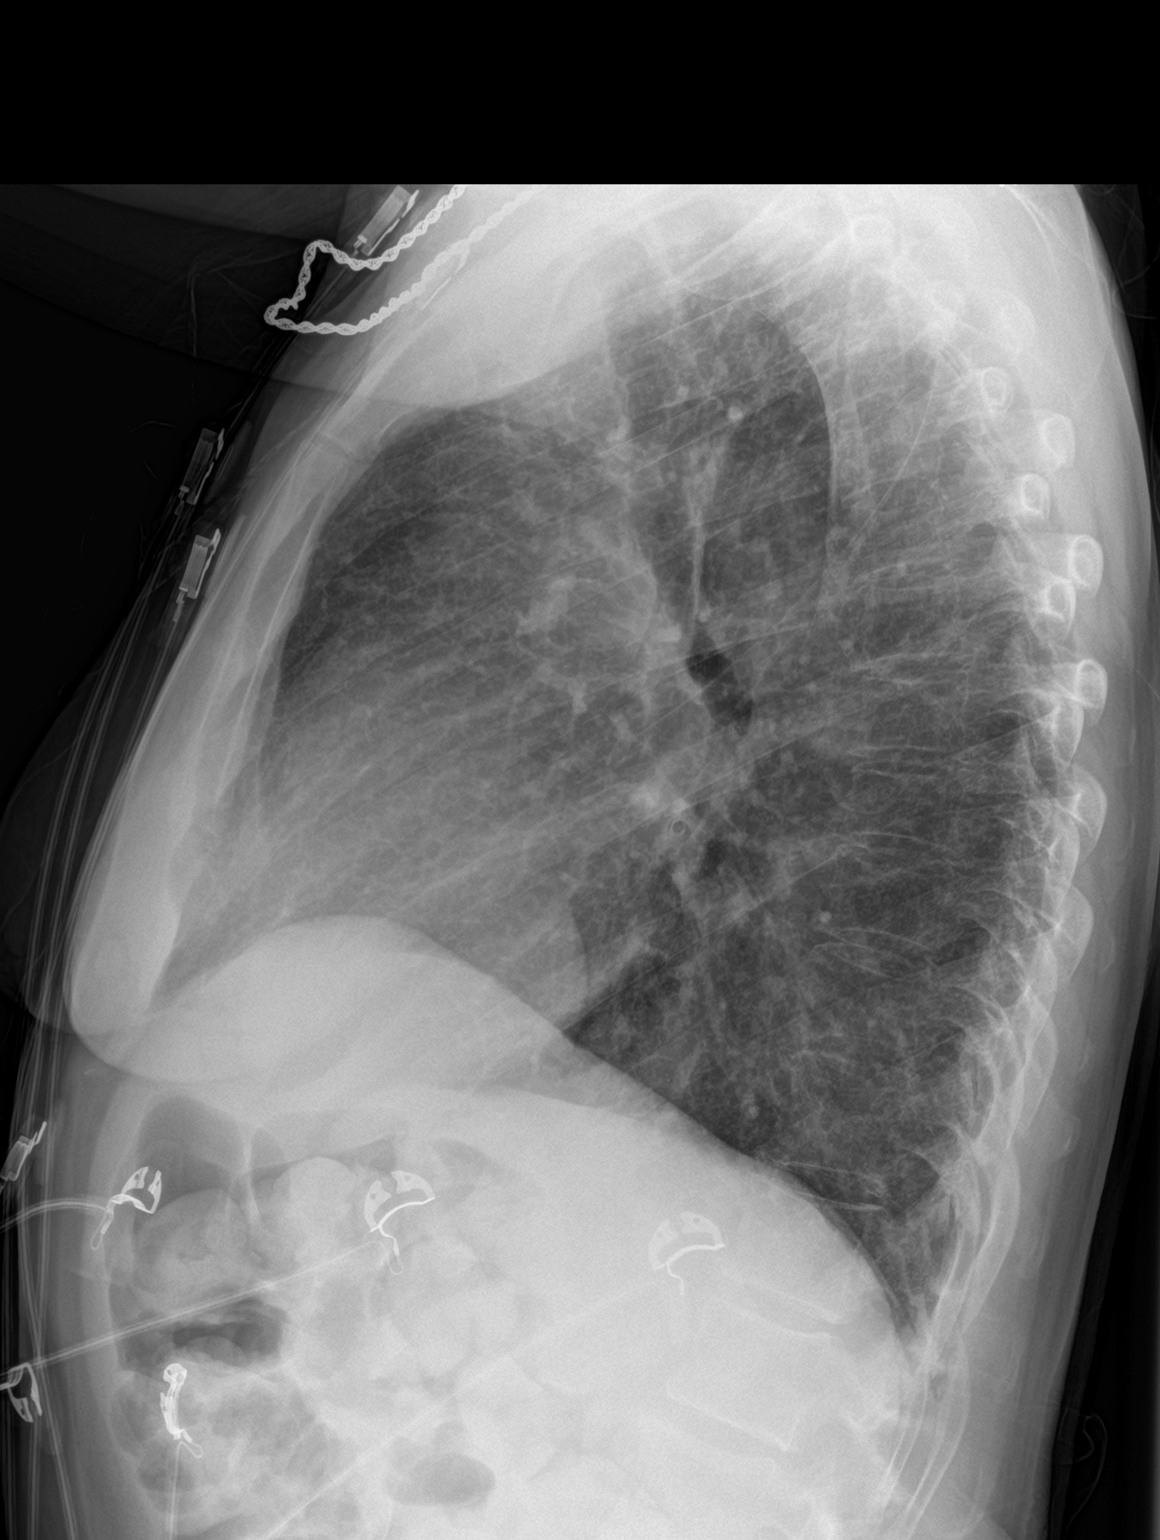

[2 of 2 positions shown; findings below may reference images not displayed]

FINDINGS: Cardiomediastinal silhouette unchanged.

No evidence of central vascular congestion or interlobular septal
thickening.

Similar appearance of coarsened interstitial markings of the
bilateral lungs.

Stigmata of emphysema, with increased retrosternal airspace,
flattened hemidiaphragms, increased AP diameter, and hyperinflation
on the AP view.

No confluent airspace disease or pleural effusion.  No pneumothorax.
IMPRESSION: Chronic lung changes and emphysema without evidence of superimposed
acute cardiopulmonary disease

## 2017-09-07 ENCOUNTER — Other Ambulatory Visit: Payer: Medicaid Other

## 2017-09-07 ENCOUNTER — Ambulatory Visit (HOSPITAL_COMMUNITY): Admission: RE | Admit: 2017-09-07 | Payer: Medicaid Other | Source: Ambulatory Visit

## 2017-10-02 ENCOUNTER — Other Ambulatory Visit (HOSPITAL_COMMUNITY): Payer: Self-pay | Admitting: Internal Medicine

## 2017-10-02 DIAGNOSIS — B1920 Unspecified viral hepatitis C without hepatic coma: Secondary | ICD-10-CM

## 2017-10-06 ENCOUNTER — Ambulatory Visit (HOSPITAL_COMMUNITY)
Admission: RE | Admit: 2017-10-06 | Discharge: 2017-10-06 | Disposition: A | Discharge status: Home / Self Care | Payer: Medicaid Other | Source: Ambulatory Visit | Attending: Internal Medicine | Admitting: Internal Medicine

## 2017-10-06 DIAGNOSIS — B1920 Unspecified viral hepatitis C without hepatic coma: Secondary | ICD-10-CM | POA: Insufficient documentation

## 2018-09-11 ENCOUNTER — Inpatient Hospital Stay (HOSPITAL_COMMUNITY)
Admission: EM | Admit: 2018-09-11 | Discharge: 2018-09-14 | DRG: 470 | Disposition: A | Discharge status: Home Health | Payer: Medicaid Other | Attending: Internal Medicine | Admitting: Internal Medicine

## 2018-09-11 ENCOUNTER — Encounter (HOSPITAL_COMMUNITY): Payer: Self-pay | Admitting: Emergency Medicine

## 2018-09-11 ENCOUNTER — Emergency Department (HOSPITAL_COMMUNITY): Payer: Medicaid Other

## 2018-09-11 ENCOUNTER — Other Ambulatory Visit: Payer: Self-pay

## 2018-09-11 DIAGNOSIS — K589 Irritable bowel syndrome without diarrhea: Secondary | ICD-10-CM | POA: Diagnosis not present

## 2018-09-11 DIAGNOSIS — W1830XA Fall on same level, unspecified, initial encounter: Secondary | ICD-10-CM | POA: Diagnosis present

## 2018-09-11 DIAGNOSIS — J449 Chronic obstructive pulmonary disease, unspecified: Secondary | ICD-10-CM | POA: Diagnosis present

## 2018-09-11 DIAGNOSIS — E785 Hyperlipidemia, unspecified: Secondary | ICD-10-CM | POA: Diagnosis present

## 2018-09-11 DIAGNOSIS — J4520 Mild intermittent asthma, uncomplicated: Secondary | ICD-10-CM | POA: Diagnosis not present

## 2018-09-11 DIAGNOSIS — S72001A Fracture of unspecified part of neck of right femur, initial encounter for closed fracture: Secondary | ICD-10-CM | POA: Diagnosis present

## 2018-09-11 DIAGNOSIS — F341 Dysthymic disorder: Secondary | ICD-10-CM | POA: Diagnosis not present

## 2018-09-11 DIAGNOSIS — Z79899 Other long term (current) drug therapy: Secondary | ICD-10-CM

## 2018-09-11 DIAGNOSIS — J45909 Unspecified asthma, uncomplicated: Secondary | ICD-10-CM | POA: Diagnosis present

## 2018-09-11 DIAGNOSIS — R112 Nausea with vomiting, unspecified: Secondary | ICD-10-CM | POA: Diagnosis not present

## 2018-09-11 DIAGNOSIS — Y92007 Garden or yard of unspecified non-institutional (private) residence as the place of occurrence of the external cause: Secondary | ICD-10-CM

## 2018-09-11 DIAGNOSIS — Z87891 Personal history of nicotine dependence: Secondary | ICD-10-CM

## 2018-09-11 DIAGNOSIS — S7291XA Unspecified fracture of right femur, initial encounter for closed fracture: Secondary | ICD-10-CM

## 2018-09-11 DIAGNOSIS — F419 Anxiety disorder, unspecified: Secondary | ICD-10-CM | POA: Diagnosis present

## 2018-09-11 DIAGNOSIS — G894 Chronic pain syndrome: Secondary | ICD-10-CM | POA: Diagnosis present

## 2018-09-11 DIAGNOSIS — Z79891 Long term (current) use of opiate analgesic: Secondary | ICD-10-CM | POA: Diagnosis not present

## 2018-09-11 DIAGNOSIS — K219 Gastro-esophageal reflux disease without esophagitis: Secondary | ICD-10-CM | POA: Diagnosis present

## 2018-09-11 DIAGNOSIS — Y93H2 Activity, gardening and landscaping: Secondary | ICD-10-CM | POA: Diagnosis not present

## 2018-09-11 DIAGNOSIS — S728X1A Other fracture of right femur, initial encounter for closed fracture: Secondary | ICD-10-CM | POA: Diagnosis not present

## 2018-09-11 DIAGNOSIS — S72009A Fracture of unspecified part of neck of unspecified femur, initial encounter for closed fracture: Secondary | ICD-10-CM

## 2018-09-11 DIAGNOSIS — E873 Alkalosis: Secondary | ICD-10-CM | POA: Diagnosis present

## 2018-09-11 DIAGNOSIS — Z7982 Long term (current) use of aspirin: Secondary | ICD-10-CM

## 2018-09-11 DIAGNOSIS — S728X1D Other fracture of right femur, subsequent encounter for closed fracture with routine healing: Secondary | ICD-10-CM | POA: Diagnosis not present

## 2018-09-11 DIAGNOSIS — D62 Acute posthemorrhagic anemia: Secondary | ICD-10-CM | POA: Diagnosis not present

## 2018-09-11 DIAGNOSIS — F329 Major depressive disorder, single episode, unspecified: Secondary | ICD-10-CM | POA: Diagnosis present

## 2018-09-11 HISTORY — DX: Fracture of unspecified part of neck of right femur, initial encounter for closed fracture: S72.001A

## 2018-09-11 LAB — BASIC METABOLIC PANEL
Anion gap: 7 (ref 5–15)
BUN: 9 mg/dL (ref 8–23)
CO2: 30 mmol/L (ref 22–32)
Calcium: 9.5 mg/dL (ref 8.9–10.3)
Chloride: 102 mmol/L (ref 98–111)
Creatinine, Ser: 0.99 mg/dL (ref 0.44–1.00)
GFR calc Af Amer: >60 mL/min (ref >60)
GFR calc non Af Amer: >60 mL/min (ref >60)
Glucose, Bld: 95 mg/dL (ref 70–99)
Potassium: 3.7 mmol/L (ref 3.5–5.1)
Sodium: 139 mmol/L (ref 135–145)

## 2018-09-11 LAB — CBC WITH DIFFERENTIAL/PLATELET
Abs Immature Granulocytes: 0.01 10*3/uL (ref 0.00–0.07)
Basophils Absolute: 0.1 10*3/uL (ref 0.0–0.1)
Basophils Relative: 1 %
Eosinophils Absolute: 0.1 10*3/uL (ref 0.0–0.5)
Eosinophils Relative: 2 %
HCT: 41.9 % (ref 36.0–46.0)
Hemoglobin: 14.1 g/dL (ref 12.0–15.0)
Immature Granulocytes: 0 %
Lymphocytes Relative: 23 %
Lymphs Abs: 1.1 10*3/uL (ref 0.7–4.0)
MCH: 30.9 pg (ref 26.0–34.0)
MCHC: 33.7 g/dL (ref 30.0–36.0)
MCV: 91.9 fL (ref 80.0–100.0)
Monocytes Absolute: 0.4 10*3/uL (ref 0.1–1.0)
Monocytes Relative: 8 %
Neutro Abs: 3.2 10*3/uL (ref 1.7–7.7)
Neutrophils Relative %: 66 %
Platelets: 244 10*3/uL (ref 150–400)
RBC: 4.56 MIL/uL (ref 3.87–5.11)
RDW: 13 % (ref 11.5–15.5)
WBC: 4.8 10*3/uL (ref 4.0–10.5)
nRBC: 0 % (ref 0.0–0.2)

## 2018-09-11 LAB — SURGICAL PCR SCREEN
MRSA, PCR: NEGATIVE
Staphylococcus aureus: POSITIVE — AB

## 2018-09-11 LAB — PROTIME-INR
INR: 1 (ref 0.8–1.2)
Prothrombin Time: 13.5 seconds (ref 11.4–15.2)

## 2018-09-11 MED ORDER — MORPHINE SULFATE (PF) 2 MG/ML IV SOLN
2.0000 mg | INTRAVENOUS | Status: DC | PRN
  Administered 2018-09-11 – 2018-09-12 (×7): 2 mg via INTRAVENOUS
  Filled 2018-09-11 (×7): qty 1

## 2018-09-11 MED ORDER — PANTOPRAZOLE SODIUM 40 MG PO TBEC
40.0000 mg | DELAYED_RELEASE_TABLET | Freq: Every day | ORAL | Status: DC
  Administered 2018-09-13 – 2018-09-14 (×2): 40 mg via ORAL
  Filled 2018-09-11 (×2): qty 1

## 2018-09-11 MED ORDER — ONDANSETRON HCL 4 MG/2ML IJ SOLN
4.0000 mg | Freq: Four times a day (QID) | INTRAMUSCULAR | Status: DC | PRN
  Administered 2018-09-11: 15:00:00 4 mg via INTRAVENOUS
  Filled 2018-09-11: qty 2

## 2018-09-11 MED ORDER — ACETAMINOPHEN 325 MG PO TABS: 650.0000 mg | ORAL_TABLET | Freq: Four times a day (QID) | ORAL | Status: DC | PRN

## 2018-09-11 MED ORDER — ONDANSETRON HCL 4 MG PO TABS: 4.0000 mg | ORAL_TABLET | Freq: Four times a day (QID) | ORAL | Status: DC | PRN

## 2018-09-11 MED ORDER — MUPIROCIN 2 % EX OINT
1.0000 "application " | TOPICAL_OINTMENT | Freq: Two times a day (BID) | CUTANEOUS | Status: DC
  Administered 2018-09-11 – 2018-09-14 (×6): 1 via NASAL
  Filled 2018-09-11: qty 22

## 2018-09-11 MED ORDER — MORPHINE SULFATE (PF) 4 MG/ML IV SOLN
4.0000 mg | Freq: Once | INTRAVENOUS | Status: AC
  Administered 2018-09-11: 12:00:00 4 mg via INTRAVENOUS
  Filled 2018-09-11: qty 1

## 2018-09-11 MED ORDER — DEXTROSE-NACL 5-0.9 % IV SOLN
INTRAVENOUS | Status: DC
  Administered 2018-09-11: 20:00:00 via INTRAVENOUS

## 2018-09-11 MED ORDER — ARIPIPRAZOLE 10 MG PO TABS
10.0000 mg | ORAL_TABLET | Freq: Every day | ORAL | Status: DC
  Administered 2018-09-11 – 2018-09-14 (×4): 10 mg via ORAL
  Filled 2018-09-11 (×4): qty 1

## 2018-09-11 MED ORDER — IBUPROFEN 400 MG PO TABS: 400.0000 mg | ORAL_TABLET | ORAL | Status: DC | PRN

## 2018-09-11 MED ORDER — CITALOPRAM HYDROBROMIDE 20 MG PO TABS
20.0000 mg | ORAL_TABLET | Freq: Every day | ORAL | Status: DC
  Administered 2018-09-12 – 2018-09-14 (×3): 20 mg via ORAL
  Filled 2018-09-11 (×3): qty 1

## 2018-09-11 MED ORDER — ALBUTEROL SULFATE (2.5 MG/3ML) 0.083% IN NEBU: 2.5000 mg | INHALATION_SOLUTION | Freq: Four times a day (QID) | RESPIRATORY_TRACT | Status: DC | PRN

## 2018-09-11 MED ORDER — CHLORHEXIDINE GLUCONATE CLOTH 2 % EX PADS
6.0000 | MEDICATED_PAD | Freq: Every day | CUTANEOUS | Status: DC
  Administered 2018-09-12 – 2018-09-14 (×3): 6 via TOPICAL

## 2018-09-11 MED ORDER — BACLOFEN 10 MG PO TABS
10.0000 mg | ORAL_TABLET | Freq: Three times a day (TID) | ORAL | Status: DC | PRN
  Administered 2018-09-13 – 2018-09-14 (×2): 10 mg via ORAL
  Filled 2018-09-11 (×2): qty 1

## 2018-09-11 MED ORDER — ACETAMINOPHEN 650 MG RE SUPP: 650.0000 mg | Freq: Four times a day (QID) | RECTAL | Status: DC | PRN

## 2018-09-11 MED ORDER — ALBUTEROL SULFATE HFA 108 (90 BASE) MCG/ACT IN AERS: 1.0000 | INHALATION_SPRAY | Freq: Four times a day (QID) | RESPIRATORY_TRACT | Status: DC | PRN

## 2018-09-11 MED ORDER — SUMATRIPTAN SUCCINATE 100 MG PO TABS
100.0000 mg | ORAL_TABLET | ORAL | Status: DC | PRN
  Filled 2018-09-11: qty 1

## 2018-09-11 MED ORDER — CLONAZEPAM 1 MG PO TABS
1.0000 mg | ORAL_TABLET | Freq: Two times a day (BID) | ORAL | Status: DC
  Administered 2018-09-11 – 2018-09-14 (×6): 1 mg via ORAL
  Filled 2018-09-11 (×6): qty 1

## 2018-09-11 MED ORDER — FLUTICASONE FUROATE-VILANTEROL 200-25 MCG/INH IN AEPB
1.0000 | INHALATION_SPRAY | Freq: Every day | RESPIRATORY_TRACT | Status: DC
  Administered 2018-09-12 – 2018-09-14 (×2): 1 via RESPIRATORY_TRACT
  Filled 2018-09-11: qty 28

## 2018-09-11 NOTE — ED Triage Notes (Signed)
Pt fell two weeks ago, slid outside in mud and fell on right hip.  Pt states pain has got worse.  Pt ambulatory
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
## 2018-09-11 NOTE — ED Notes (Signed)
Patient transported to X-ray 

## 2018-09-11 NOTE — Plan of Care (Signed)
  Problem: Coping: Goal: Level of anxiety will decrease Outcome: Progressing   Problem: Skin Integrity: Goal: Risk for impaired skin integrity will decrease 09/11/2018 2345 by Roselind Rily, RN Outcome: Progressing 09/11/2018 2345 by Roselind Rily, RN Outcome: Progressing
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
## 2018-09-12 ENCOUNTER — Inpatient Hospital Stay (HOSPITAL_COMMUNITY): Payer: Medicaid Other

## 2018-09-12 ENCOUNTER — Encounter (HOSPITAL_COMMUNITY)
Admission: EM | Disposition: A | Discharge status: Home Health | Payer: Self-pay | Source: Home / Self Care | Attending: Internal Medicine

## 2018-09-12 ENCOUNTER — Encounter (HOSPITAL_COMMUNITY): Payer: Self-pay | Admitting: *Deleted

## 2018-09-12 ENCOUNTER — Inpatient Hospital Stay (HOSPITAL_COMMUNITY): Payer: Medicaid Other | Admitting: Certified Registered"

## 2018-09-12 DIAGNOSIS — S728X1A Other fracture of right femur, initial encounter for closed fracture: Secondary | ICD-10-CM

## 2018-09-12 DIAGNOSIS — F341 Dysthymic disorder: Secondary | ICD-10-CM

## 2018-09-12 DIAGNOSIS — J4520 Mild intermittent asthma, uncomplicated: Secondary | ICD-10-CM

## 2018-09-12 HISTORY — PX: TOTAL HIP ARTHROPLASTY: SHX124

## 2018-09-12 LAB — CBC
HCT: 36.5 % (ref 36.0–46.0)
Hemoglobin: 11.9 g/dL — ABNORMAL LOW (ref 12.0–15.0)
MCH: 29.8 pg (ref 26.0–34.0)
MCHC: 32.6 g/dL (ref 30.0–36.0)
MCV: 91.3 fL (ref 80.0–100.0)
Platelets: 273 10*3/uL (ref 150–400)
RBC: 4 MIL/uL (ref 3.87–5.11)
RDW: 12.8 % (ref 11.5–15.5)
WBC: 6.1 10*3/uL (ref 4.0–10.5)
nRBC: 0 % (ref 0.0–0.2)

## 2018-09-12 LAB — BASIC METABOLIC PANEL
Anion gap: 10 (ref 5–15)
BUN: 9 mg/dL (ref 8–23)
CO2: 28 mmol/L (ref 22–32)
Calcium: 9 mg/dL (ref 8.9–10.3)
Chloride: 103 mmol/L (ref 98–111)
Creatinine, Ser: 0.93 mg/dL (ref 0.44–1.00)
GFR calc Af Amer: >60 mL/min (ref >60)
GFR calc non Af Amer: >60 mL/min (ref >60)
Glucose, Bld: 112 mg/dL — ABNORMAL HIGH (ref 70–99)
Potassium: 3.7 mmol/L (ref 3.5–5.1)
Sodium: 141 mmol/L (ref 135–145)

## 2018-09-12 LAB — HIV ANTIBODY (ROUTINE TESTING W REFLEX): HIV Screen 4th Generation wRfx: NONREACTIVE

## 2018-09-12 SURGERY — ARTHROPLASTY, HIP, TOTAL, ANTERIOR APPROACH
Anesthesia: Spinal | Site: Hip | Laterality: Right

## 2018-09-12 MED ORDER — TRANEXAMIC ACID-NACL 1000-0.7 MG/100ML-% IV SOLN
1000.0000 mg | INTRAVENOUS | Status: AC
  Administered 2018-09-12: 1000 mg via INTRAVENOUS
  Filled 2018-09-12: qty 100

## 2018-09-12 MED ORDER — HYDROMORPHONE HCL 1 MG/ML IJ SOLN
0.5000 mg | INTRAMUSCULAR | Status: DC | PRN
  Administered 2018-09-12: 0.5 mg via INTRAVENOUS
  Filled 2018-09-12: qty 1

## 2018-09-12 MED ORDER — BUPIVACAINE HCL (PF) 0.5 % IJ SOLN
INTRAMUSCULAR | Status: DC | PRN
  Administered 2018-09-12: 14 mg via INTRATHECAL

## 2018-09-12 MED ORDER — 0.9 % SODIUM CHLORIDE (POUR BTL) OPTIME
TOPICAL | Status: DC | PRN
  Administered 2018-09-12 (×2): 1000 mL

## 2018-09-12 MED ORDER — ONDANSETRON HCL 4 MG/2ML IJ SOLN
INTRAMUSCULAR | Status: DC | PRN
  Administered 2018-09-12: 4 mg via INTRAVENOUS

## 2018-09-12 MED ORDER — SODIUM CHLORIDE 0.9 % IR SOLN
Status: DC | PRN
  Administered 2018-09-12: 3000 mL

## 2018-09-12 MED ORDER — CHLORHEXIDINE GLUCONATE 4 % EX LIQD: 60.0000 mL | Freq: Once | CUTANEOUS | Status: DC

## 2018-09-12 MED ORDER — FENTANYL CITRATE (PF) 250 MCG/5ML IJ SOLN
INTRAMUSCULAR | Status: AC
  Filled 2018-09-12: qty 5

## 2018-09-12 MED ORDER — DEXAMETHASONE SODIUM PHOSPHATE 10 MG/ML IJ SOLN
INTRAMUSCULAR | Status: AC
  Filled 2018-09-12: qty 1

## 2018-09-12 MED ORDER — METOCLOPRAMIDE HCL 5 MG PO TABS: 5.0000 mg | ORAL_TABLET | Freq: Three times a day (TID) | ORAL | Status: DC | PRN

## 2018-09-12 MED ORDER — CEFAZOLIN SODIUM-DEXTROSE 1-4 GM/50ML-% IV SOLN
1.0000 g | Freq: Four times a day (QID) | INTRAVENOUS | Status: AC
  Administered 2018-09-12 – 2018-09-13 (×2): 1 g via INTRAVENOUS
  Filled 2018-09-12 (×3): qty 50

## 2018-09-12 MED ORDER — ASPIRIN 81 MG PO CHEW
81.0000 mg | CHEWABLE_TABLET | Freq: Two times a day (BID) | ORAL | Status: DC
  Administered 2018-09-12 – 2018-09-14 (×4): 81 mg via ORAL
  Filled 2018-09-12 (×4): qty 1

## 2018-09-12 MED ORDER — TRANEXAMIC ACID 1000 MG/10ML IV SOLN
INTRAVENOUS | Status: DC | PRN
  Administered 2018-09-12: 2000 mg via TOPICAL

## 2018-09-12 MED ORDER — OXYCODONE HCL 5 MG PO TABS
5.0000 mg | ORAL_TABLET | ORAL | Status: DC | PRN
  Administered 2018-09-12: 5 mg via ORAL
  Administered 2018-09-13: 10 mg via ORAL
  Filled 2018-09-12: qty 1
  Filled 2018-09-12: qty 2

## 2018-09-12 MED ORDER — TRANEXAMIC ACID 1000 MG/10ML IV SOLN
2000.0000 mg | Freq: Once | INTRAVENOUS | Status: DC
  Filled 2018-09-12: qty 20

## 2018-09-12 MED ORDER — CEFAZOLIN SODIUM-DEXTROSE 2-4 GM/100ML-% IV SOLN
2.0000 g | INTRAVENOUS | Status: AC
  Administered 2018-09-12: 2 g via INTRAVENOUS
  Filled 2018-09-12: qty 100

## 2018-09-12 MED ORDER — PROPOFOL 10 MG/ML IV BOLUS
INTRAVENOUS | Status: DC | PRN
  Administered 2018-09-12: 10 mg via INTRAVENOUS

## 2018-09-12 MED ORDER — LACTATED RINGERS IV SOLN
INTRAVENOUS | Status: DC
  Administered 2018-09-12: 14:00:00 via INTRAVENOUS

## 2018-09-12 MED ORDER — LIDOCAINE 2% (20 MG/ML) 5 ML SYRINGE
INTRAMUSCULAR | Status: AC
  Filled 2018-09-12: qty 5

## 2018-09-12 MED ORDER — PROPOFOL 500 MG/50ML IV EMUL
INTRAVENOUS | Status: DC | PRN
  Administered 2018-09-12: 30 ug/kg/min via INTRAVENOUS

## 2018-09-12 MED ORDER — ROCURONIUM BROMIDE 50 MG/5ML IV SOSY
PREFILLED_SYRINGE | INTRAVENOUS | Status: AC
  Filled 2018-09-12: qty 5

## 2018-09-12 MED ORDER — PHENOL 1.4 % MT LIQD: 1.0000 | OROMUCOSAL | Status: DC | PRN

## 2018-09-12 MED ORDER — SODIUM CHLORIDE 0.9 % IV SOLN
INTRAVENOUS | Status: DC | PRN
  Administered 2018-09-12: 15:00:00 50 ug/min via INTRAVENOUS

## 2018-09-12 MED ORDER — DOCUSATE SODIUM 100 MG PO CAPS
100.0000 mg | ORAL_CAPSULE | Freq: Two times a day (BID) | ORAL | Status: DC
  Administered 2018-09-12 – 2018-09-14 (×4): 100 mg via ORAL
  Filled 2018-09-12 (×4): qty 1

## 2018-09-12 MED ORDER — MIDAZOLAM HCL 2 MG/2ML IJ SOLN
INTRAMUSCULAR | Status: AC
  Filled 2018-09-12: qty 2

## 2018-09-12 MED ORDER — GABAPENTIN 600 MG PO TABS
600.0000 mg | ORAL_TABLET | Freq: Three times a day (TID) | ORAL | Status: DC
  Administered 2018-09-12 – 2018-09-14 (×5): 600 mg via ORAL
  Filled 2018-09-12 (×5): qty 1

## 2018-09-12 MED ORDER — ONDANSETRON HCL 4 MG/2ML IJ SOLN
4.0000 mg | Freq: Four times a day (QID) | INTRAMUSCULAR | Status: DC | PRN
  Administered 2018-09-13: 4 mg via INTRAVENOUS
  Filled 2018-09-12: qty 2

## 2018-09-12 MED ORDER — FENTANYL CITRATE (PF) 250 MCG/5ML IJ SOLN
INTRAMUSCULAR | Status: DC | PRN
  Administered 2018-09-12: 50 ug via INTRAVENOUS
  Administered 2018-09-12 (×2): 25 ug via INTRAVENOUS

## 2018-09-12 MED ORDER — DEXAMETHASONE SODIUM PHOSPHATE 10 MG/ML IJ SOLN
INTRAMUSCULAR | Status: DC | PRN
  Administered 2018-09-12: 4 mg via INTRAVENOUS

## 2018-09-12 MED ORDER — PROPOFOL 500 MG/50ML IV EMUL
INTRAVENOUS | Status: AC
  Filled 2018-09-12: qty 100

## 2018-09-12 MED ORDER — MENTHOL 3 MG MT LOZG: 1.0000 | LOZENGE | OROMUCOSAL | Status: DC | PRN

## 2018-09-12 MED ORDER — HYDROXYZINE HCL 25 MG PO TABS
25.0000 mg | ORAL_TABLET | Freq: Every day | ORAL | Status: DC
  Administered 2018-09-12 – 2018-09-13 (×2): 25 mg via ORAL
  Filled 2018-09-12 (×2): qty 1

## 2018-09-12 MED ORDER — ALBUMIN HUMAN 5 % IV SOLN
INTRAVENOUS | Status: DC | PRN
  Administered 2018-09-12: 17:00:00 via INTRAVENOUS

## 2018-09-12 MED ORDER — ALPRAZOLAM 0.5 MG PO TABS: 0.5000 mg | ORAL_TABLET | Freq: Two times a day (BID) | ORAL | Status: DC | PRN

## 2018-09-12 MED ORDER — LACTATED RINGERS IV SOLN: INTRAVENOUS | Status: DC

## 2018-09-12 MED ORDER — PROPOFOL 10 MG/ML IV BOLUS
INTRAVENOUS | Status: AC
  Filled 2018-09-12: qty 20

## 2018-09-12 MED ORDER — ACETAMINOPHEN 325 MG PO TABS
325.0000 mg | ORAL_TABLET | Freq: Four times a day (QID) | ORAL | Status: DC | PRN
  Administered 2018-09-13 (×2): 650 mg via ORAL
  Filled 2018-09-12 (×2): qty 2

## 2018-09-12 MED ORDER — ONDANSETRON HCL 4 MG/2ML IJ SOLN
INTRAMUSCULAR | Status: AC
  Filled 2018-09-12: qty 2

## 2018-09-12 MED ORDER — ATORVASTATIN CALCIUM 10 MG PO TABS
20.0000 mg | ORAL_TABLET | Freq: Every day | ORAL | Status: DC
  Administered 2018-09-13 – 2018-09-14 (×2): 20 mg via ORAL
  Filled 2018-09-12 (×2): qty 2

## 2018-09-12 MED ORDER — SUCCINYLCHOLINE CHLORIDE 200 MG/10ML IV SOSY
PREFILLED_SYRINGE | INTRAVENOUS | Status: AC
  Filled 2018-09-12: qty 10

## 2018-09-12 MED ORDER — POVIDONE-IODINE 10 % EX SWAB: 2.0000 "application " | Freq: Once | CUTANEOUS | Status: DC

## 2018-09-12 MED ORDER — METOCLOPRAMIDE HCL 5 MG/ML IJ SOLN: 5.0000 mg | Freq: Three times a day (TID) | INTRAMUSCULAR | Status: DC | PRN

## 2018-09-12 MED ORDER — ONDANSETRON HCL 4 MG PO TABS: 4.0000 mg | ORAL_TABLET | Freq: Four times a day (QID) | ORAL | Status: DC | PRN

## 2018-09-12 MED ORDER — LACTATED RINGERS IV SOLN
INTRAVENOUS | Status: AC
  Administered 2018-09-13: 18:00:00 via INTRAVENOUS

## 2018-09-12 MED ORDER — LACTATED RINGERS IV SOLN
INTRAVENOUS | Status: DC | PRN
  Administered 2018-09-12 (×2): via INTRAVENOUS

## 2018-09-12 MED ORDER — MIDAZOLAM HCL 2 MG/2ML IJ SOLN
INTRAMUSCULAR | Status: DC | PRN
  Administered 2018-09-12: 2 mg via INTRAVENOUS

## 2018-09-12 SURGICAL SUPPLY — 52 items
BAG DECANTER FOR FLEXI CONT (MISCELLANEOUS) ×2 IMPLANT
BLADE 15 SAFETY STRL DISP (BLADE) ×2 IMPLANT
BLADE CLIPPER SURG (BLADE) ×2 IMPLANT
BLADE SAW SGTL 18X1.27X75 (BLADE) ×2 IMPLANT
CELLS DAT CNTRL 66122 CELL SVR (MISCELLANEOUS) ×1 IMPLANT
COVER PERINEAL POST (MISCELLANEOUS) ×2 IMPLANT
COVER SURGICAL LIGHT HANDLE (MISCELLANEOUS) ×2 IMPLANT
COVER WAND RF STERILE (DRAPES) ×2 IMPLANT
CUP ACET PINNACLE SECTR 48MM (Joint) ×1 IMPLANT
DRAPE C-ARM 42X72 X-RAY (DRAPES) ×2 IMPLANT
DRAPE STERI IOBAN 125X83 (DRAPES) ×2 IMPLANT
DRAPE U-SHAPE 47X51 STRL (DRAPES) ×6 IMPLANT
DRSG AQUACEL AG ADV 3.5X10 (GAUZE/BANDAGES/DRESSINGS) ×2 IMPLANT
DURAPREP 26ML APPLICATOR (WOUND CARE) ×2 IMPLANT
ELECT BLADE 4.0 EZ CLEAN MEGAD (MISCELLANEOUS) ×2
ELECT REM PT RETURN 9FT ADLT (ELECTROSURGICAL) ×2
ELECTRODE BLDE 4.0 EZ CLN MEGD (MISCELLANEOUS) ×1 IMPLANT
ELECTRODE REM PT RTRN 9FT ADLT (ELECTROSURGICAL) ×1 IMPLANT
GLOVE BIOGEL PI IND STRL 8 (GLOVE) ×1 IMPLANT
GLOVE BIOGEL PI INDICATOR 8 (GLOVE) ×1
GLOVE SURG ORTHO 8.0 STRL STRW (GLOVE) ×4 IMPLANT
GOWN STRL REUS W/ TWL LRG LVL3 (GOWN DISPOSABLE) ×3 IMPLANT
GOWN STRL REUS W/TWL LRG LVL3 (GOWN DISPOSABLE) ×3
HANDPIECE INTERPULSE COAX TIP (DISPOSABLE) ×1
HEAD FEMORAL 32 CERAMIC (Hips) ×2 IMPLANT
HOOD PEEL AWAY FLYTE STAYCOOL (MISCELLANEOUS) ×6 IMPLANT
KIT BASIN OR (CUSTOM PROCEDURE TRAY) ×2 IMPLANT
KIT TURNOVER KIT B (KITS) ×2 IMPLANT
NEEDLE HYPO 22GX1.5 SAFETY (NEEDLE) ×2 IMPLANT
NS IRRIG 1000ML POUR BTL (IV SOLUTION) ×2 IMPLANT
PACK TOTAL JOINT (CUSTOM PROCEDURE TRAY) ×2 IMPLANT
PAD ARMBOARD 7.5X6 YLW CONV (MISCELLANEOUS) ×2 IMPLANT
PINN ALTRX NEUT ID X OD 32X48 ×2 IMPLANT
PINNSECTOR W/GRIP ACE CUP 48MM (Joint) ×2 IMPLANT
RTRCTR WOUND ALEXIS 18CM MED (MISCELLANEOUS) ×2
SCREW 6.5MMX25MM (Screw) ×2 IMPLANT
SET HNDPC FAN SPRY TIP SCT (DISPOSABLE) ×1 IMPLANT
STEM CORAIL KA13 (Stem) ×2 IMPLANT
STRIP CLOSURE SKIN 1/2X4 (GAUZE/BANDAGES/DRESSINGS) ×2 IMPLANT
SUT ETHIBOND NAB CT1 #1 30IN (SUTURE) ×4 IMPLANT
SUT MNCRL AB 3-0 PS2 18 (SUTURE) ×2 IMPLANT
SUT VIC AB 0 CT1 27 (SUTURE) ×3
SUT VIC AB 0 CT1 27XBRD ANBCTR (SUTURE) ×3 IMPLANT
SUT VIC AB 1 CT1 27 (SUTURE) ×5
SUT VIC AB 1 CT1 27XBRD ANBCTR (SUTURE) ×5 IMPLANT
SUT VIC AB 2-0 CT1 27 (SUTURE) ×3
SUT VIC AB 2-0 CT1 TAPERPNT 27 (SUTURE) ×3 IMPLANT
SYR 30ML LL (SYRINGE) ×2 IMPLANT
TOWEL OR 17X24 6PK STRL BLUE (TOWEL DISPOSABLE) ×2 IMPLANT
TOWEL OR 17X26 10 PK STRL BLUE (TOWEL DISPOSABLE) ×2 IMPLANT
TRAY FOLEY MTR SLVR 16FR STAT (SET/KITS/TRAYS/PACK) ×2 IMPLANT
WATER STERILE IRR 1000ML POUR (IV SOLUTION) ×2 IMPLANT

## 2018-09-12 NOTE — Brief Op Note (Signed)
09/12/2018  5:30 PM  PATIENT:  Catherine [name]  63 y.o. female  PRE-OPERATIVE DIAGNOSIS:  right hip fracture  POST-OPERATIVE DIAGNOSIS:  right hip fracture  PROCEDURE:  Procedure(s): Right TOTAL HIP REPLACEMENT  SURGEON:  Surgeon(s): Marlou Sa, Tonna Corner, MD  ASSISTANT: Benita Stabile pa  ANESTHESIA:   spinal  EBL: 300 ml    Total I/O In: 2050 [I.V.:1800; IV Piggyback:250] Out: 1025 [Urine:700; Blood:325]  BLOOD ADMINISTERED: none  DRAINS: none   LOCAL MEDICATIONS USED:  none  SPECIMEN:  No Specimen  COUNTS:  YES  TOURNIQUET:  * No tourniquets in log *  DICTATION: .Other Dictation: Dictation Number [id number]  PLAN OF CARE: Admit to inpatient   PATIENT DISPOSITION:  PACU - hemodynamically stable

## 2018-09-12 NOTE — Anesthesia Preprocedure Evaluation (Signed)
Anesthesia Evaluation  Patient identified by MRN, date of birth, ID band Patient awake    Reviewed: Allergy & Precautions, NPO status , Patient's Chart, lab work & pertinent test results  Airway Mallampati: II  TM Distance: >3 FB Neck ROM: Full    Dental no notable dental hx. (+) Edentulous Upper, Edentulous Lower   Pulmonary asthma , COPD, former smoker,    Pulmonary exam normal breath sounds clear to auscultation       Cardiovascular Exercise Tolerance: Good Normal cardiovascular exam Rhythm:Regular Rate:Normal     Neuro/Psych Anxiety  Neuromuscular disease    GI/Hepatic Neg liver ROS, GERD  ,  Endo/Other  negative endocrine ROS  Renal/GU negative Renal ROS     Musculoskeletal   Abdominal   Peds  Hematology negative hematology ROS (+)   Anesthesia Other Findings   Reproductive/Obstetrics                            Lab Results  Component Value Date   CREATININE 0.93 09/12/2018   BUN 9 09/12/2018   NA 141 09/12/2018   K 3.7 09/12/2018   CL 103 09/12/2018   CO2 28 09/12/2018      Lab Results  Component Value Date   WBC 6.1 09/12/2018   HGB 11.9 (L) 09/12/2018   HCT 36.5 09/12/2018   MCV 91.3 09/12/2018   PLT 273 09/12/2018    Anesthesia Physical Anesthesia Plan  ASA: II  Anesthesia Plan: Spinal   Post-op Pain Management:    Induction: Intravenous  PONV Risk Score and Plan: Treatment may vary due to age or medical condition  Airway Management Planned: Natural Airway and Simple Face Mask  Additional Equipment:   Intra-op Plan:   Post-operative Plan:   Informed Consent: I have reviewed the patients History and Physical, chart, labs and discussed the procedure including the risks, benefits and alternatives for the proposed anesthesia with the patient or authorized representative who has indicated his/her understanding and acceptance.     Dental advisory  given  Plan Discussed with: CRNA  Anesthesia Plan Comments: (R THR on Spinal)        Anesthesia Quick Evaluation

## 2018-09-12 NOTE — Transfer of Care (Signed)
Immediate Anesthesia Transfer of Care Note  Patient: Catherine [name]  Procedure(s) Performed: Right TOTAL HIP REPLACEMENT (Right Hip)  Patient Location: PACU  Anesthesia Type:Spinal  Level of Consciousness: awake, alert  and oriented  Airway & Oxygen Therapy: Patient Spontanous Breathing and Patient connected to nasal cannula oxygen  Post-op Assessment: Report given to RN and Post -op Vital signs reviewed and stable  Post vital signs: Reviewed and stable  Last Vitals:  Vitals Value Taken Time  BP 83/58 09/12/2018  5:37 PM  Temp    Pulse 79 09/12/2018  5:40 PM  Resp 16 09/12/2018  5:40 PM  SpO2 100 % 09/12/2018  5:40 PM  Vitals shown include unvalidated device data.  Last Pain:  Vitals:   09/12/18 1107  TempSrc:   PainSc: 1          Complications: No apparent anesthesia complications
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
## 2018-09-12 NOTE — Social Work (Signed)
CSW acknowledging consult for SNF placement. Will follow for therapy recommendations. Should pt be recommended for SNF will need to agree to stay for 30 days and may have to utilize any check that she receives to cover additional costs.   Pt from home with husband, will follow.    Westley Hummer, MSW, [place] Work [phone]
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
## 2018-09-12 NOTE — Anesthesia Procedure Notes (Signed)
Procedure Name: MAC Date/Time: 09/12/2018 2:45 PM Performed by: Teressa Lower., CRNA Pre-anesthesia Checklist: Patient identified, Emergency Drugs available, Suction available, Patient being monitored and Timeout performed Patient Re-evaluated:Patient Re-evaluated prior to induction Oxygen Delivery Method: Nasal cannula

## 2018-09-13 ENCOUNTER — Encounter (HOSPITAL_COMMUNITY): Payer: Self-pay | Admitting: Orthopedic Surgery

## 2018-09-13 DIAGNOSIS — S728X1D Other fracture of right femur, subsequent encounter for closed fracture with routine healing: Secondary | ICD-10-CM

## 2018-09-13 LAB — BASIC METABOLIC PANEL
Anion gap: 8 (ref 5–15)
BUN: 6 mg/dL — ABNORMAL LOW (ref 8–23)
CO2: 27 mmol/L (ref 22–32)
Calcium: 8.9 mg/dL (ref 8.9–10.3)
Chloride: 102 mmol/L (ref 98–111)
Creatinine, Ser: 0.87 mg/dL (ref 0.44–1.00)
GFR calc Af Amer: >60 mL/min (ref >60)
GFR calc non Af Amer: >60 mL/min (ref >60)
Glucose, Bld: 126 mg/dL — ABNORMAL HIGH (ref 70–99)
Potassium: 4 mmol/L (ref 3.5–5.1)
Sodium: 137 mmol/L (ref 135–145)

## 2018-09-13 LAB — CBC
HCT: 29.1 % — ABNORMAL LOW (ref 36.0–46.0)
Hemoglobin: 9.8 g/dL — ABNORMAL LOW (ref 12.0–15.0)
MCH: 30.1 pg (ref 26.0–34.0)
MCHC: 33.7 g/dL (ref 30.0–36.0)
MCV: 89.3 fL (ref 80.0–100.0)
Platelets: 236 10*3/uL (ref 150–400)
RBC: 3.26 MIL/uL — ABNORMAL LOW (ref 3.87–5.11)
RDW: 12.8 % (ref 11.5–15.5)
WBC: 11 10*3/uL — ABNORMAL HIGH (ref 4.0–10.5)
nRBC: 0 % (ref 0.0–0.2)

## 2018-09-13 MED ORDER — TRAMADOL HCL 50 MG PO TABS
50.0000 mg | ORAL_TABLET | Freq: Four times a day (QID) | ORAL | Status: DC | PRN
  Administered 2018-09-13 – 2018-09-14 (×4): 50 mg via ORAL
  Filled 2018-09-13 (×4): qty 1

## 2018-09-13 NOTE — Progress Notes (Signed)
Subjective: Patient stable.  Pain control.  She is having some nausea this morning.  Was able to walk around the room.   Objective: Vital signs in last 24 hours: Temp:  [97.2 F (36.2 C)-101.5 F (38.6 C)] 99 F (37.2 C) (04/23 0745) Pulse Rate:  [67-95] 67 (04/23 0446) Resp:  [9-20] 18 (04/23 0446) BP: (83-156)/(58-87) 156/85 (04/23 0446) SpO2:  [98 %-100 %] 98 % (04/23 0446) Weight:  [52 kg] 52 kg (04/22 1407)  Intake/Output from previous day: 04/22 0701 - 04/23 0700 In: 2050 [I.V.:1800; IV Piggyback:250] Out: 2225 [Urine:1900; Blood:325] Intake/Output this shift: No intake/output data recorded.  Exam:  Dorsiflexion/Plantar flexion intact  Labs: Recent Labs    09/11/18 1210 09/12/18 0250 09/13/18 0415  HGB 14.1 11.9* 9.8*   Recent Labs    09/12/18 0250 09/13/18 0415  WBC 6.1 11.0*  RBC 4.00 3.26*  HCT 36.5 29.1*  PLT 273 236   Recent Labs    09/12/18 0250 09/13/18 0415  NA 141 137  K 3.7 4.0  CL 103 102  CO2 28 27  BUN 9 6*  CREATININE 0.93 0.87  GLUCOSE 112* 126*  CALCIUM 9.0 8.9   Recent Labs    09/11/18 1210  INR 1.0    Assessment/Plan: Impression is right total hip replacement.  Postop radiographs look good.  Plan to mobilize and possible discharge to home or skilled nursing tomorrow or Saturday.   G Scott Dean 09/13/2018, 1:05 PM
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
## 2018-09-13 NOTE — Progress Notes (Signed)
Orthopedic Tech Progress Note Patient Details:  Catherine [name] 11-28-1955 [id number]  Patient ID: Catherine [name], female   DOB: [birth year], 63 y.o.   MRN: [id number] Pt in bed without proper base for OHF. Asked RN to call us when beds are switched.  Karolee Stamps 09/13/2018, 2:17 PM
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
## 2018-09-13 NOTE — TOC Initial Note (Addendum)
Transition of Care [facility]) - Initial/Assessment Note    Patient Details  Name: Catherine [name] MRN: [id number] Date of Birth: 06/22/1955  Transition of Care [facility]) CM/SW Contact:    Marilu Favre, RN Phone Number: 09/13/2018, 2:47 PM  Clinical Narrative:                 Spoke to patient at bedside. Patient will discharge to :  [address], [place], [place] [address] with her husband and sister in law.   SHe is agreeable to HHPT. Medicare.gov list provided. Will need orders and face to face.   Called for walker and 3 in 1   Referral called to [facility] , awaiting call back.  Accepted for HHPT by DAn with [place]   Expected Discharge Plan: [place] Barriers to Discharge: Continued Medical Work up   Patient Goals and CMS Choice Patient states their goals for this hospitalization and ongoing recovery are:: to go home  CMS Medicare.gov Compare Post Acute Care list provided to:: Patient Choice offered to / list presented to : Patient  Expected Discharge Plan and Services Expected Discharge Plan: [place]   Discharge Planning Services: CM Consult   Living arrangements for the past 2 months: Single Family Home                 DME Arranged: 3-N-1, Walker rolling DME Agency: [employer] Date DME Agency Contacted: 09/13/18 Time DME Agency Contacted: [phone] Representative spoke with at DME Agency: [place]: PT          Prior Living Arrangements/Services Living arrangements for the past 2 months: [place] Lives with:: Spouse Patient language and need for interpreter reviewed:: Yes Do you feel safe going back to the place where you live?: Yes      Need for Family Participation in Patient Care: Yes (Comment) Care giver support system in place?: Yes (comment)   Criminal Activity/Legal Involvement Pertinent to Current Situation/Hospitalization: No - Comment as needed  Activities of Daily Living Home Assistive  Devices/Equipment: Eyeglasses ADL Screening (condition at time of admission) Patient's cognitive ability adequate to safely complete daily activities?: Yes Is the patient deaf or have difficulty hearing?: No Does the patient have difficulty seeing, even when wearing glasses/contacts?: No Does the patient have difficulty concentrating, remembering, or making decisions?: No Patient able to express need for assistance with ADLs?: Yes Does the patient have difficulty dressing or bathing?: No Independently performs ADLs?: Yes (appropriate for developmental age) Does the patient have difficulty walking or climbing stairs?: Yes Weakness of Legs: Right Weakness of Arms/Hands: None  Permission Sought/Granted                  Emotional Assessment Appearance:: Appears stated age Attitude/Demeanor/Rapport: Engaged Affect (typically observed): Accepting Orientation: : Oriented to Self, Oriented to Place, Oriented to  Time, Oriented to Situation   Psych Involvement: No (comment)  Admission diagnosis:  Closed right hip fracture, initial encounter [facility]) [S72.001A] Patient Active Problem List   Diagnosis Date Noted  . Hip fracture ([place]) 09/11/2018  . Right femoral fracture ([place]) 09/11/2018  . Alkalosis 09/11/2018  . Long term current use of opiate analgesic 12/26/2016  . Long term prescription opiate use 12/26/2016  . Opiate use 12/26/2016  . Chronic pain syndrome 12/26/2016  . Low back pain (primary) (bilateral) (R>L) 12/26/2016  . Lower extremity pain (secondary) (bilateral) (R>L) 12/26/2016  . Knee pain, chronic (tertiary) (left) 12/26/2016  . Guaiac  positive stools 10/31/2016  . CONTACT DERMATITIS 12/30/2008  . BACK PAIN 12/30/2008  . ABDOMINAL PAIN, LEFT LOWER QUADRANT 11/18/2008  . IRRITABLE BOWEL SYNDROME 10/16/2008  . DEPRESSION/ANXIETY 07/21/2008  . PANCREATITIS, ACUTE, HX OF 02/11/2008  . HYPERLIPIDEMIA 06/07/2007  . MIGRAINE HEADACHE 03/16/2006  . CARPAL TUNNEL SYNDROME  03/16/2006  . PERIPHERAL NEUROPATHY 03/16/2006  . ALLERGIC RHINITIS 03/16/2006  . Asthma 03/16/2006  . GERD 03/16/2006  . 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 INCONTINENCE 03/16/2006  . GLUCOSE INTOLERANCE, HX OF 03/16/2006   PCP:  [facility] [place]:   [place], [place] [address] [place] [address] Phone: [phone] Fax: [phone]  [facility] [phone] - [place], [place] AT [place] [address] [place] [address] Phone: [phone] Fax: [phone]     Social Determinants of Health (SDOH) Interventions    Readmission Risk Interventions No flowsheet data found.
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
## 2018-09-13 NOTE — Anesthesia Postprocedure Evaluation (Signed)
Anesthesia Post Note  Patient: Catherine [name]  Procedure(s) Performed: Right TOTAL HIP REPLACEMENT (Right Hip)     Patient location during evaluation: Nursing Unit Anesthesia Type: Spinal Level of consciousness: oriented and awake and alert Pain management: pain level controlled Vital Signs Assessment: post-procedure vital signs reviewed and stable Respiratory status: spontaneous breathing and respiratory function stable Cardiovascular status: blood pressure returned to baseline and stable Postop Assessment: no headache, no backache, no apparent nausea or vomiting and patient able to bend at knees Anesthetic complications: no    Last Vitals:  Vitals:   09/13/18 0745 09/13/18 1312  BP:  139/78  Pulse:  62  Resp:    Temp: 37.2 C 36.7 C  SpO2:  98%    Last Pain:  Vitals:   09/13/18 1548  TempSrc:   PainSc: 5    Pain Goal: Patients Stated Pain Goal: 2 (09/12/18 2147)                 Barnet Glasgow

## 2018-09-14 LAB — CBC
HCT: 29.7 % — ABNORMAL LOW (ref 36.0–46.0)
Hemoglobin: 10.2 g/dL — ABNORMAL LOW (ref 12.0–15.0)
MCH: 30.2 pg (ref 26.0–34.0)
MCHC: 34.3 g/dL (ref 30.0–36.0)
MCV: 87.9 fL (ref 80.0–100.0)
Platelets: 221 10*3/uL (ref 150–400)
RBC: 3.38 MIL/uL — ABNORMAL LOW (ref 3.87–5.11)
RDW: 13.2 % (ref 11.5–15.5)
WBC: 11.1 10*3/uL — ABNORMAL HIGH (ref 4.0–10.5)
nRBC: 0 % (ref 0.0–0.2)

## 2018-09-14 LAB — BASIC METABOLIC PANEL
Anion gap: 10 (ref 5–15)
BUN: 7 mg/dL — ABNORMAL LOW (ref 8–23)
CO2: 27 mmol/L (ref 22–32)
Calcium: 9 mg/dL (ref 8.9–10.3)
Chloride: 104 mmol/L (ref 98–111)
Creatinine, Ser: 0.72 mg/dL (ref 0.44–1.00)
GFR calc Af Amer: >60 mL/min (ref >60)
GFR calc non Af Amer: >60 mL/min (ref >60)
Glucose, Bld: 122 mg/dL — ABNORMAL HIGH (ref 70–99)
Potassium: 3.7 mmol/L (ref 3.5–5.1)
Sodium: 141 mmol/L (ref 135–145)

## 2018-09-14 MED ORDER — ASPIRIN 81 MG PO CHEW: 81.0000 mg | CHEWABLE_TABLET | Freq: Two times a day (BID) | ORAL | 0 refills | Status: DC

## 2018-09-14 MED ORDER — ACETAMINOPHEN 325 MG PO TABS: 325.0000 mg | ORAL_TABLET | Freq: Four times a day (QID) | ORAL | 0 refills | Status: AC | PRN

## 2018-09-14 NOTE — Plan of Care (Signed)
  Problem: Education: Goal: Knowledge of General Education information will improve Description Including pain rating scale, medication(s)/side effects and non-pharmacologic comfort measures Outcome: Progressing   Problem: Activity: Goal: Risk for activity intolerance will decrease Outcome: Progressing   Problem: Elimination: Goal: Will not experience complications related to bowel motility Outcome: Progressing Goal: Will not experience complications related to urinary retention Outcome: Progressing   Problem: Pain Managment: Goal: General experience of comfort will improve Outcome: Progressing   Problem: Skin Integrity: Goal: Risk for impaired skin integrity will decrease Outcome: Progressing

## 2018-09-14 NOTE — Progress Notes (Signed)
Subjective: Patient stable.  Pain improving.   Objective: Vital signs in last 24 hours: Temp:  [98.1 F (36.7 C)-99.2 F (37.3 C)] 99.2 F (37.3 C) (04/24 0509) Pulse Rate:  [62-72] 72 (04/24 0509) Resp:  [14-16] 16 (04/24 0509) BP: (137-142)/(69-78) 137/69 (04/24 0509) SpO2:  [95 %-98 %] 98 % (04/24 0843)  Intake/Output from previous day: 04/23 0701 - 04/24 0700 In: 517.3 [P.O.:240; I.V.:277.3] Out: 3000 [Urine:3000] Intake/Output this shift: Total I/O In: -  Out: 300 [Urine:300]  Exam:  Dorsiflexion/Plantar flexion intact  Labs: Recent Labs    09/12/18 0250 09/13/18 0415 09/14/18 0142  HGB 11.9* 9.8* 10.2*   Recent Labs    09/13/18 0415 09/14/18 0142  WBC 11.0* 11.1*  RBC 3.26* 3.38*  HCT 29.1* 29.7*  PLT 236 221   Recent Labs    09/13/18 0415 09/14/18 0142  NA 137 141  K 4.0 3.7  CL 102 104  CO2 27 27  BUN 6* 7*  CREATININE 0.87 0.72  GLUCOSE 126* 122*  CALCIUM 8.9 9.0   No results for input(s): LABPT, INR in the last 72 hours.  Assessment/Plan: Plan at this time is ambulation and weightbearing as tolerated.  Okay to discharge from orthopedic standpoint.  Weightbearing as tolerated right lower extremity.  Prescription for pain medicine and muscle relaxer and aspirin for DVT prophylaxis on the chart   G Alphonzo Severance 09/14/2018, 12:12 PM
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
## 2018-09-17 ENCOUNTER — Telehealth (INDEPENDENT_AMBULATORY_CARE_PROVIDER_SITE_OTHER): Payer: Self-pay | Admitting: Orthopedic Surgery

## 2018-09-17 ENCOUNTER — Telehealth (INDEPENDENT_AMBULATORY_CARE_PROVIDER_SITE_OTHER): Payer: Self-pay

## 2018-09-17 MED ORDER — TRAMADOL HCL 50 MG PO TABS: ORAL_TABLET | ORAL | 0 refills | Status: AC

## 2018-09-17 NOTE — Telephone Encounter (Signed)
Called to pharmacy 

## 2018-09-17 NOTE — Telephone Encounter (Signed)
Please advise. Thanks.  

## 2018-09-17 NOTE — Telephone Encounter (Signed)
Patient calling to request something stronger for the pain. She states she had right hip surgery last Wednesday and the Tylenol she is taking is not helping.    Follow up is May 8th.  Patient uses [facility] in [place].

## 2018-09-17 NOTE — Telephone Encounter (Signed)
Stacey with [facility] called requesting verbal order for 1wk/1 2wk/1 1wk/1 I gave verbal order.

## 2018-09-17 NOTE — Telephone Encounter (Signed)
Okay for tramadol 1 p.o. every 6 hours as needed pain #30 with no refills thanks

## 2018-09-19 ENCOUNTER — Telehealth: Payer: Self-pay | Admitting: Orthopedic Surgery

## 2018-09-19 NOTE — Telephone Encounter (Signed)
Catherine [name] with [name]  Verbal order for physical therapy  2x for 3 wks

## 2018-09-19 NOTE — Telephone Encounter (Signed)
Catherine [name] with [facility]  Needs verbal order for physical therapy  2x for 3 wks

## 2018-09-19 NOTE — Telephone Encounter (Signed)
IC verbal given.  

## 2018-09-20 NOTE — Telephone Encounter (Signed)
thx

## 2018-09-28 ENCOUNTER — Ambulatory Visit (INDEPENDENT_AMBULATORY_CARE_PROVIDER_SITE_OTHER): Payer: Medicaid Other

## 2018-09-28 ENCOUNTER — Other Ambulatory Visit: Payer: Self-pay

## 2018-09-28 ENCOUNTER — Ambulatory Visit (INDEPENDENT_AMBULATORY_CARE_PROVIDER_SITE_OTHER): Payer: Medicaid Other | Admitting: Orthopedic Surgery

## 2018-09-28 ENCOUNTER — Encounter: Payer: Self-pay | Admitting: Orthopedic Surgery

## 2018-09-28 DIAGNOSIS — Z96641 Presence of right artificial hip joint: Secondary | ICD-10-CM

## 2018-09-30 ENCOUNTER — Encounter: Payer: Self-pay | Admitting: Orthopedic Surgery
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
## 2018-10-26 ENCOUNTER — Other Ambulatory Visit: Payer: Self-pay

## 2018-10-26 ENCOUNTER — Encounter: Payer: Self-pay | Admitting: Orthopedic Surgery

## 2018-10-26 ENCOUNTER — Ambulatory Visit (INDEPENDENT_AMBULATORY_CARE_PROVIDER_SITE_OTHER): Payer: Medicaid Other | Admitting: Orthopedic Surgery

## 2018-10-26 DIAGNOSIS — Z96641 Presence of right artificial hip joint: Secondary | ICD-10-CM

## 2018-10-30 ENCOUNTER — Encounter: Payer: Self-pay | Admitting: Orthopedic Surgery
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
## 2019-05-15 ENCOUNTER — Other Ambulatory Visit: Payer: Self-pay | Admitting: Internal Medicine

## 2019-05-15 DIAGNOSIS — Z13820 Encounter for screening for osteoporosis: Secondary | ICD-10-CM

## 2019-05-20 ENCOUNTER — Other Ambulatory Visit (HOSPITAL_COMMUNITY): Payer: Self-pay | Admitting: Internal Medicine

## 2019-05-20 DIAGNOSIS — Z13820 Encounter for screening for osteoporosis: Secondary | ICD-10-CM

## 2019-08-02 ENCOUNTER — Ambulatory Visit (HOSPITAL_COMMUNITY)
Admission: RE | Admit: 2019-08-02 | Discharge: 2019-08-02 | Disposition: A | Discharge status: Home / Self Care | Payer: Medicaid Other | Source: Ambulatory Visit | Attending: Internal Medicine | Admitting: Internal Medicine

## 2019-08-02 ENCOUNTER — Other Ambulatory Visit: Payer: Self-pay

## 2019-08-02 DIAGNOSIS — Z13820 Encounter for screening for osteoporosis: Secondary | ICD-10-CM | POA: Diagnosis present

## 2019-09-27 DIAGNOSIS — G589 Mononeuropathy, unspecified: Secondary | ICD-10-CM | POA: Insufficient documentation

## 2019-09-27 DIAGNOSIS — R251 Tremor, unspecified: Secondary | ICD-10-CM | POA: Insufficient documentation

## 2019-09-27 DIAGNOSIS — M25519 Pain in unspecified shoulder: Secondary | ICD-10-CM | POA: Insufficient documentation

## 2019-09-30 DIAGNOSIS — G4700 Insomnia, unspecified: Secondary | ICD-10-CM | POA: Insufficient documentation

## 2020-01-14 ENCOUNTER — Other Ambulatory Visit (HOSPITAL_COMMUNITY): Payer: Self-pay | Admitting: Internal Medicine

## 2020-01-14 DIAGNOSIS — Z1231 Encounter for screening mammogram for malignant neoplasm of breast: Secondary | ICD-10-CM

## 2020-01-22 ENCOUNTER — Ambulatory Visit (HOSPITAL_COMMUNITY)
Admission: RE | Admit: 2020-01-22 | Discharge: 2020-01-22 | Disposition: A | Discharge status: Home / Self Care | Payer: Medicaid Other | Source: Ambulatory Visit | Attending: Internal Medicine | Admitting: Internal Medicine

## 2020-01-22 ENCOUNTER — Other Ambulatory Visit: Payer: Self-pay

## 2020-01-22 DIAGNOSIS — Z1231 Encounter for screening mammogram for malignant neoplasm of breast: Secondary | ICD-10-CM | POA: Diagnosis present

## 2020-08-03 ENCOUNTER — Other Ambulatory Visit (HOSPITAL_COMMUNITY): Payer: Self-pay | Admitting: Internal Medicine

## 2020-08-03 DIAGNOSIS — Z13820 Encounter for screening for osteoporosis: Secondary | ICD-10-CM

## 2020-12-17 ENCOUNTER — Ambulatory Visit: Payer: Medicaid Other | Admitting: Orthopedic Surgery

## 2021-01-04 ENCOUNTER — Ambulatory Visit (INDEPENDENT_AMBULATORY_CARE_PROVIDER_SITE_OTHER): Payer: Medicare Other | Admitting: Orthopedic Surgery

## 2021-01-04 ENCOUNTER — Other Ambulatory Visit: Payer: Self-pay

## 2021-01-04 ENCOUNTER — Encounter: Payer: Self-pay | Admitting: Orthopedic Surgery

## 2021-01-04 ENCOUNTER — Ambulatory Visit: Payer: Medicare Other

## 2021-01-04 VITALS — BP 126/84 | HR 78 | Ht 63.0 in | Wt 122.0 lb

## 2021-01-04 DIAGNOSIS — G8929 Other chronic pain: Secondary | ICD-10-CM

## 2021-01-04 DIAGNOSIS — M25562 Pain in left knee: Secondary | ICD-10-CM

## 2021-01-04 DIAGNOSIS — M1712 Unilateral primary osteoarthritis, left knee: Secondary | ICD-10-CM

## 2021-01-04 MED ORDER — MELOXICAM 7.5 MG PO TABS: 7.5000 mg | ORAL_TABLET | Freq: Every day | ORAL | 5 refills | Status: DC
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
## 2021-02-08 ENCOUNTER — Ambulatory Visit: Payer: Medicare Other | Admitting: Orthopedic Surgery

## 2021-02-15 ENCOUNTER — Encounter: Payer: Self-pay | Admitting: Orthopedic Surgery

## 2021-02-15 ENCOUNTER — Ambulatory Visit (INDEPENDENT_AMBULATORY_CARE_PROVIDER_SITE_OTHER): Payer: Medicare Other | Admitting: Orthopedic Surgery

## 2021-02-15 ENCOUNTER — Other Ambulatory Visit: Payer: Self-pay

## 2021-02-15 VITALS — BP 140/85 | HR 77 | Ht 63.0 in | Wt 125.0 lb

## 2021-02-15 DIAGNOSIS — F419 Anxiety disorder, unspecified: Secondary | ICD-10-CM | POA: Insufficient documentation

## 2021-02-15 DIAGNOSIS — G8929 Other chronic pain: Secondary | ICD-10-CM

## 2021-02-15 DIAGNOSIS — M25562 Pain in left knee: Secondary | ICD-10-CM | POA: Diagnosis not present

## 2021-02-15 DIAGNOSIS — J449 Chronic obstructive pulmonary disease, unspecified: Secondary | ICD-10-CM | POA: Insufficient documentation

## 2021-02-15 DIAGNOSIS — M5430 Sciatica, unspecified side: Secondary | ICD-10-CM | POA: Insufficient documentation

## 2021-02-15 NOTE — Progress Notes (Signed)
Chief Complaint  Patient presents with   Knee Pain    Left     Encounter Diagnosis  Name Primary?   Chronic pain of left knee Yes   65 year old female previous total hip done in [place] presents with osteoarthritis left knee.  History of surgery in [facility] probably arthroscopic no scars were noted on the knee she was on a trial of Mobic for 1 month x-rays show degenerative arthritis in the lateral and patellofemoral compartments with a 12 degree tibiofemoral valgus alignment with effusion  Lissa did not receive or get much help with pain relief from the meloxicam  She still has an effusion her lateral joint line is still tender her motion is restricted at the terminal range of motion in extension but the ligaments feel stable.  The McMurray's sign is equivocal  Recommend MRI left knee to evaluate the meniscus and cartilage to determine if surgical intervention is needed such as arthroscopic versus total knee replacement

## 2021-02-15 NOTE — Patient Instructions (Signed)
Your MRI does not require insurance authorization please go ahead and call to schedule your appointment with [facility] within at least one (1) week.   Central Scheduling [phone]

## 2021-02-25 ENCOUNTER — Ambulatory Visit: Payer: Medicare Other | Admitting: Orthopedic Surgery

## 2021-02-26 ENCOUNTER — Other Ambulatory Visit: Payer: Self-pay

## 2021-02-26 ENCOUNTER — Ambulatory Visit (HOSPITAL_COMMUNITY)
Admission: RE | Admit: 2021-02-26 | Discharge: 2021-02-26 | Disposition: A | Discharge status: Home / Self Care | Payer: Medicare Other | Source: Ambulatory Visit | Attending: Orthopedic Surgery | Admitting: Orthopedic Surgery

## 2021-02-26 DIAGNOSIS — M25562 Pain in left knee: Secondary | ICD-10-CM | POA: Diagnosis not present

## 2021-02-26 DIAGNOSIS — G8929 Other chronic pain: Secondary | ICD-10-CM | POA: Insufficient documentation

## 2021-03-01 ENCOUNTER — Other Ambulatory Visit (HOSPITAL_COMMUNITY): Payer: Self-pay | Admitting: Emergency Medicine

## 2021-03-01 DIAGNOSIS — Z1231 Encounter for screening mammogram for malignant neoplasm of breast: Secondary | ICD-10-CM

## 2021-03-03 ENCOUNTER — Other Ambulatory Visit: Payer: Self-pay

## 2021-03-03 ENCOUNTER — Ambulatory Visit (INDEPENDENT_AMBULATORY_CARE_PROVIDER_SITE_OTHER): Payer: Medicare Other | Admitting: Orthopedic Surgery

## 2021-03-03 DIAGNOSIS — G8929 Other chronic pain: Secondary | ICD-10-CM | POA: Diagnosis not present

## 2021-03-03 DIAGNOSIS — M1710 Unilateral primary osteoarthritis, unspecified knee: Secondary | ICD-10-CM

## 2021-03-03 DIAGNOSIS — M25562 Pain in left knee: Secondary | ICD-10-CM | POA: Diagnosis not present

## 2021-03-03 DIAGNOSIS — M1712 Unilateral primary osteoarthritis, left knee: Secondary | ICD-10-CM

## 2021-03-03 NOTE — Progress Notes (Signed)
Chief Complaint  Patient presents with   Results    MRI LEFT knee    Encounter Diagnoses  Name Primary?   Chronic pain of left knee Yes   Primary localized osteoarthritis of knee left knee lateral compartment     Catherine [name] had her MRI  Still having significant left knee pain  She has reportedly had surgery on her left knee but not sure what was done apparently had arthroscopic surgery.  She presented to me with left knee pain after seeing Dr. Merlene Laughter.  She complains of pain swelling and difficulty bending her knee she feels like the pain is diffuse  She did have successful right total hip for arthritis  Her MRI report will be included but my interpretation is that she has complete loss of the lateral meniscus whether it is from surgery or degeneration is unclear.  She also has degeneration of the cartilage in the lateral compartment and would be best served with a knee replacement  IMPRESSION: 1. Complete attenuation of the lateral meniscus likely reflecting prior tear or meniscectomy. 2. Tricompartmental cartilage abnormalities as described above most severe in the lateral femorotibial compartment.     Electronically Signed   By: Kathreen Devoid M.D.   On: 02/28/2021 14:06    She was not to excited about having a knee replacement and says she wants to think about it so I gave her some information about preparing for knee replacement actual knee replacement and postop course  She will think about it and let us know if she wants to proceed with surgery left total knee

## 2021-03-04 ENCOUNTER — Ambulatory Visit (HOSPITAL_COMMUNITY)
Admission: RE | Admit: 2021-03-04 | Discharge: 2021-03-04 | Disposition: A | Discharge status: Home / Self Care | Payer: Medicare Other | Source: Ambulatory Visit | Attending: Emergency Medicine | Admitting: Emergency Medicine

## 2021-03-04 DIAGNOSIS — Z1231 Encounter for screening mammogram for malignant neoplasm of breast: Secondary | ICD-10-CM | POA: Insufficient documentation

## 2021-07-06 ENCOUNTER — Other Ambulatory Visit: Payer: Self-pay | Admitting: Orthopedic Surgery

## 2021-11-29 ENCOUNTER — Ambulatory Visit (INDEPENDENT_AMBULATORY_CARE_PROVIDER_SITE_OTHER): Payer: 59

## 2021-11-29 ENCOUNTER — Encounter: Payer: Self-pay | Admitting: Orthopedic Surgery

## 2021-11-29 ENCOUNTER — Ambulatory Visit (INDEPENDENT_AMBULATORY_CARE_PROVIDER_SITE_OTHER): Payer: 59 | Admitting: Orthopedic Surgery

## 2021-11-29 VITALS — BP 108/66 | HR 70 | Ht 63.0 in | Wt 127.4 lb

## 2021-11-29 DIAGNOSIS — M25512 Pain in left shoulder: Secondary | ICD-10-CM

## 2021-11-29 NOTE — Patient Instructions (Signed)
Physical therapy  [phone]

## 2021-11-29 NOTE — Addendum Note (Signed)
Addended by: Moreen Fowler R on: 11/29/2021 02:02 PM   Modules accepted: Orders
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
## 2021-12-24 ENCOUNTER — Other Ambulatory Visit: Payer: Self-pay | Admitting: Orthopedic Surgery

## 2022-01-26 ENCOUNTER — Other Ambulatory Visit (HOSPITAL_COMMUNITY): Payer: Self-pay | Admitting: Emergency Medicine

## 2022-01-26 DIAGNOSIS — Z1231 Encounter for screening mammogram for malignant neoplasm of breast: Secondary | ICD-10-CM

## 2022-03-07 ENCOUNTER — Ambulatory Visit (HOSPITAL_COMMUNITY)
Admission: RE | Admit: 2022-03-07 | Discharge: 2022-03-07 | Disposition: A | Discharge status: Home / Self Care | Payer: 59 | Source: Ambulatory Visit | Attending: Emergency Medicine | Admitting: Emergency Medicine

## 2022-03-07 DIAGNOSIS — Z1231 Encounter for screening mammogram for malignant neoplasm of breast: Secondary | ICD-10-CM | POA: Insufficient documentation
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
## 2022-04-25 ENCOUNTER — Encounter: Payer: Self-pay | Admitting: Pain Medicine

## 2022-04-25 ENCOUNTER — Ambulatory Visit (HOSPITAL_BASED_OUTPATIENT_CLINIC_OR_DEPARTMENT_OTHER): Payer: 59 | Admitting: Pain Medicine

## 2022-04-25 ENCOUNTER — Ambulatory Visit
Admission: RE | Admit: 2022-04-25 | Discharge: 2022-04-25 | Disposition: A | Discharge status: Home / Self Care | Payer: 59 | Source: Ambulatory Visit | Attending: Pain Medicine | Admitting: Pain Medicine

## 2022-04-25 VITALS — BP 131/89 | HR 83 | Temp 98.0°F | Ht 63.0 in | Wt 124.0 lb

## 2022-04-25 DIAGNOSIS — M25512 Pain in left shoulder: Secondary | ICD-10-CM | POA: Insufficient documentation

## 2022-04-25 DIAGNOSIS — G894 Chronic pain syndrome: Secondary | ICD-10-CM | POA: Insufficient documentation

## 2022-04-25 DIAGNOSIS — M545 Low back pain, unspecified: Secondary | ICD-10-CM | POA: Insufficient documentation

## 2022-04-25 DIAGNOSIS — M899 Disorder of bone, unspecified: Secondary | ICD-10-CM | POA: Insufficient documentation

## 2022-04-25 DIAGNOSIS — Z789 Other specified health status: Secondary | ICD-10-CM | POA: Insufficient documentation

## 2022-04-25 DIAGNOSIS — S71131S Puncture wound without foreign body, right thigh, sequela: Secondary | ICD-10-CM | POA: Insufficient documentation

## 2022-04-25 DIAGNOSIS — M25551 Pain in right hip: Secondary | ICD-10-CM

## 2022-04-25 DIAGNOSIS — G8929 Other chronic pain: Secondary | ICD-10-CM | POA: Insufficient documentation

## 2022-04-25 DIAGNOSIS — M25562 Pain in left knee: Secondary | ICD-10-CM

## 2022-04-25 DIAGNOSIS — Z96641 Presence of right artificial hip joint: Secondary | ICD-10-CM

## 2022-04-25 DIAGNOSIS — Z79899 Other long term (current) drug therapy: Secondary | ICD-10-CM | POA: Insufficient documentation

## 2022-04-25 DIAGNOSIS — Z79891 Long term (current) use of opiate analgesic: Secondary | ICD-10-CM

## 2022-04-25 DIAGNOSIS — W3400XS Accidental discharge from unspecified firearms or gun, sequela: Secondary | ICD-10-CM

## 2022-04-25 DIAGNOSIS — S71131A Puncture wound without foreign body, right thigh, initial encounter: Secondary | ICD-10-CM | POA: Insufficient documentation

## 2022-04-25 NOTE — Progress Notes (Signed)
Safety precautions to be maintained throughout the outpatient stay will include: orient to surroundings, keep bed in low position, maintain call bell within reach at all times, provide assistance with transfer out of bed and ambulation.  

## 2022-04-25 NOTE — Patient Instructions (Signed)
____________________________________________________________________________________________  New Patients  Welcome to [facility].   Initial Visit The first or initial visit consists of an evaluation only.   Interventional pain management.  We offer therapies other than opioid controlled substances to manage chronic pain. These include, but are not limited to, diagnostic, therapeutic, and palliative specialized injection therapies (i.e.: Epidural Steroids, Facet Blocks, etc.). We specialize in a variety of nerve blocks as well as radiofrequency treatments. We offer pain implant evaluations and trials, as well as follow up management. In addition we also provide a variety joint injections, including Viscosupplementation (AKA: Gel Therapy).  Prescription Pain Medication We provide evaluations for/of pharmacologic therapies. Recommendations will follow CDC Guidelines.  We no longer take patients for long-term medication management. We will not be taking over your pain medications.  ____________________________________________________________________________________________    ____________________________________________________________________________________________  Patient Information update  To: All of our patients.  Re: Name change.  It has been made official that our current name, "[facility]"   will soon be changed to "[facility]".   The purpose of this change is to eliminate any confusion created by the concept of our practice being a "Medication Management Pain Clinic". In the past this has led to the misconception that we treat pain primarily by the use of prescription medications.  Nothing can be farther from the truth.   Understanding PAIN MANAGEMENT: To further understand what our practice does, you  first have to understand that "Pain Management" is a subspecialty that requires additional training once a physician has completed their specialty training, which can be in either Anesthesia, Neurology, Psychiatry, or Physical Medicine and Rehabilitation (PMR). Each one of these contributes to the final approach taken by each physician to the management of their patient's pain. To be a "Pain Management Specialist" you must have first completed one of the specialty trainings below.  Anesthesiologists - trained in clinical pharmacology and interventional techniques such as nerve blockade and regional as well as central neuroanatomy. They are trained to block pain before, during, and after surgical interventions.  Neurologists - trained in the diagnosis and pharmacological treatment of complex neurological conditions, such as Multiple Sclerosis, Parkinson's, spinal cord injuries, and other systemic conditions that may be associated with symptoms that may include but are not limited to pain. They tend to rely primarily on the treatment of chronic pain using prescription medications.  Psychiatrist - trained in conditions affecting the psychosocial wellbeing of patients including but not limited to depression, anxiety, schizophrenia, personality disorders, addiction, and other substance use disorders that may be associated with chronic pain. They tend to rely primarily on the treatment of chronic pain using prescription medications.   Physical Medicine and Rehabilitation (PMR) physicians, also known as physiatrists - trained to treat a wide variety of medical conditions affecting the brain, spinal cord, nerves, bones, joints, ligaments, muscles, and tendons. Their training is primarily aimed at treating patients that have suffered injuries that have caused severe physical impairment. Their training is primarily aimed at the physical therapy and rehabilitation of those patients. They may also work alongside  orthopedic surgeons or neurosurgeons using their expertise in assisting surgical patients to recover after their surgeries.  INTERVENTIONAL PAIN MANAGEMENT is sub-subspecialty of Pain Management.  Our physicians are Board-certified in Anesthesia, Pain Management, and Interventional Pain Management.  This meaning that not only have they been trained and Board-certified in their specialty of Anesthesia, and   subspecialty of Pain Management, but they have also received further training in the sub-subspecialty of Interventional Pain Management, in order to become Board-certified as INTERVENTIONAL PAIN MANAGEMENT SPECIALIST.    Mission: Our goal is to use our skills in  [place] as alternatives to the chronic use of prescription opioid medications for the treatment of pain. To make this more clear, we have changed our name to reflect what we do and offer. We will continue to offer medication management assessment and recommendations, but we will not be taking over any patient's medication management.  ____________________________________________________________________________________________     ____________________________________________________________________________________________  Drug Holidays (Slow)  What is a "Drug Holiday"? Drug Holiday: is the name given to the period of time during which a patient stops taking a medication(s) for the purpose of eliminating tolerance to the drug.  Benefits Improved effectiveness of opioid medication. Decreased opioid dose needed to achieve benefits. Improved pain with lesser dose. Ending dependence on high dose therapy. Possible decrease in cost of therapy. It may uncover "opioid-induced hyperalgesia". (OIH)  What is "opioid hyperalgesia"? It is an increased paradoxical pain sensitization state caused by exposure to opioids, whereby a patient receiving opioids for the treatment of pain could actually become more sensitive to a  painful stimuli. Stopping the opioid pain medication, contrary to the expected increase in the pain, it actually decreases or completely eliminates the pain. Ref.: "A comprehensive review of opioid-induced hyperalgesia". Brion Aliment, et.al. Pain Physician. 2011 Mar-Apr;14(2):145-61.  What is tolerance? Tolerance: is the progressive decreased in effectiveness of a drug due to its repetitive use. With repetitive use, the body gets use to the medication and as a consequence, it loses its effectiveness. This is a common problem seen with opioid pain medications. As a result, a larger dose of the drug is needed to achieve the same effect that used to be obtained with a smaller dose.  How long should a "Drug Holiday" last? Effectiveness depends on the patient staying off all opioid pain medicines for a minimum of 14 consecutive days. (2 weeks)  How about just taking less of the medicine? Does not work. This will not eliminate the excess receptors.  How about switching to a different pain medicine? (AKA. "Opioid rotation") This "technique" was promoted by studies funded by [employer], such as [employer], creators of "OxyContin". It does not work. It only creates the illusion of effectiveness by taking advantage of inaccurate equivalent dose calculations between different opioid medications.   Should I stop the medicine "cold [place]"? No. You should always coordinate with your Pain Specialist so that he/she can provide you with the correct medication dose to make the transition as smoothly as possible.  How do I stop the medicine? Slowly. You will be instructed to decrease the daily amount of pills that you take by one (1) pill every seven (7) days. This is called a "slow downward taper" of your dose. For example: if you normally take four (4) pills per day, you will be asked to drop this dose to three (3) pills per day for seven (7) days, then to two (2) pills per day for seven (7) days,  then to one (1) per day for seven (7) days, and at the end of those last seven (7) days, this is when the "Drug Holiday" would start.   How about withdrawals? Typically, what triggers withdrawals is the sudden stop of a high dose opioid medication. Withdrawals can usually be avoided by slowly decreasing the dose over a  prolonged period of time. If you attempt to stop your medication abruptly, withdrawals may be possible.  What are withdrawals? Withdrawals: refers to the wide range of symptoms that occur after stopping or dramatically reducing opiate drugs after heavy and prolonged use. Withdrawal symptoms do not occur to patients that use low dose opioids, or those who take the medication sporadically. Contrary to benzodiazepine (example: Valium, Xanax, etc.) or alcohol withdrawals ("Delirium Tremens"), opioid withdrawals are not lethal. Withdrawals are the physical manifestation of the body getting rid of the excess receptors.  Withdrawal Symptoms Early symptoms of withdrawal may include: Agitation Anxiety Muscle aches Increased tearing Insomnia Runny nose Sweating Yawning  Late symptoms of withdrawal may include: Abdominal cramping Diarrhea Dilated pupils Goose bumps Nausea Vomiting  Will I experience withdrawals? Due to the slow nature of the taper, it is very unlikely.  What is a slow taper? Taper: refers to the gradual decrease in dose.  (Last update: 04/18/2022) ____________________________________________________________________________________________

## 2022-04-26 ENCOUNTER — Encounter: Payer: Self-pay | Admitting: Pain Medicine

## 2022-04-26 DIAGNOSIS — M2400 Loose body in unspecified joint: Secondary | ICD-10-CM | POA: Insufficient documentation

## 2022-04-26 DIAGNOSIS — M19012 Primary osteoarthritis, left shoulder: Secondary | ICD-10-CM | POA: Insufficient documentation

## 2022-04-26 DIAGNOSIS — M1712 Unilateral primary osteoarthritis, left knee: Secondary | ICD-10-CM | POA: Insufficient documentation

## 2022-04-26 DIAGNOSIS — M5136 Other intervertebral disc degeneration, lumbar region: Secondary | ICD-10-CM | POA: Insufficient documentation

## 2022-04-26 LAB — COMP. METABOLIC PANEL (12)
Albumin: 4.4 g/dL (ref 3.9–4.9)
Total Protein: 6.6 g/dL (ref 6.0–8.5)
eGFR: 62 mL/min/{1.73_m2} (ref >59)

## 2022-04-26 LAB — 25-HYDROXY VITAMIN D LCMS D2+D3

## 2022-04-26 LAB — SEDIMENTATION RATE: Sed Rate: 2 mm/hr (ref 0–40)

## 2022-04-26 LAB — MAGNESIUM: Magnesium: 1.9 mg/dL (ref 1.6–2.3)

## 2022-04-28 LAB — COMP. METABOLIC PANEL (12)
AST: 17 IU/L (ref 0–40)
Albumin/Globulin Ratio: 2 (ref 1.2–2.2)
Alkaline Phosphatase: 65 IU/L (ref 44–121)
BUN/Creatinine Ratio: 8 — ABNORMAL LOW (ref 12–28)
BUN: 8 mg/dL (ref 8–27)
Bilirubin Total: 0.5 mg/dL (ref 0.0–1.2)
Calcium: 9.5 mg/dL (ref 8.7–10.3)
Chloride: 101 mmol/L (ref 96–106)
Creatinine, Ser: 1 mg/dL (ref 0.57–1.00)
Globulin, Total: 2.2 g/dL (ref 1.5–4.5)
Glucose: 89 mg/dL (ref 70–99)
Potassium: 4.2 mmol/L (ref 3.5–5.2)
Sodium: 141 mmol/L (ref 134–144)

## 2022-04-28 LAB — 25-HYDROXY VITAMIN D LCMS D2+D3: 25-Hydroxy, Vitamin D-2: <1 ng/mL

## 2022-04-28 LAB — COMPLIANCE DRUG ANALYSIS, UR

## 2022-04-28 LAB — VITAMIN B12: Vitamin B-12: 317 pg/mL (ref 232–1245)

## 2022-04-28 LAB — C-REACTIVE PROTEIN: CRP: <1 mg/L (ref 0–10)

## 2022-06-02 ENCOUNTER — Other Ambulatory Visit: Payer: Self-pay | Admitting: Orthopedic Surgery
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
## 2022-06-06 NOTE — Patient Instructions (Signed)
____________________________________________________________________________________________  Patient Information update  To: All of our patients.  Re: Name change.  It has been made official that our current name, "[facility]"   will soon be changed to "[facility]".   The purpose of this change is to eliminate any confusion created by the concept of our practice being a "[facility] Pain [facility]". In the past this has led to the misconception that we treat pain primarily by the use of prescription medications.  Nothing can be farther from the truth.   Understanding PAIN MANAGEMENT: To further understand what our practice does, you first have to understand that "Pain Management" is a subspecialty that requires additional training once a physician has completed their specialty training, which can be in either Anesthesia, Neurology, Psychiatry, or Physical Medicine and Rehabilitation (PMR). Each one of these contributes to the final approach taken by each physician to the management of their patient's pain. To be a "Pain Management Specialist" you must have first completed one of the specialty trainings below.  Anesthesiologists - trained in clinical pharmacology and interventional techniques such as nerve blockade and regional as well as central neuroanatomy. They are trained to block pain before, during, and after surgical interventions.  Neurologists - trained in the diagnosis and pharmacological treatment of complex neurological conditions, such as Multiple Sclerosis, Parkinson's, spinal cord injuries, and other systemic conditions that may be associated with symptoms that may include but are not limited to pain. They tend to rely primarily on the treatment of chronic pain using prescription medications.  Psychiatrist - trained in conditions affecting the psychosocial  wellbeing of patients including but not limited to depression, anxiety, schizophrenia, personality disorders, addiction, and other substance use disorders that may be associated with chronic pain. They tend to rely primarily on the treatment of chronic pain using prescription medications.   Physical Medicine and Rehabilitation (PMR) physicians, also known as physiatrists - trained to treat a wide variety of medical conditions affecting the brain, spinal cord, nerves, bones, joints, ligaments, muscles, and tendons. Their training is primarily aimed at treating patients that have suffered injuries that have caused severe physical impairment. Their training is primarily aimed at the physical therapy and rehabilitation of those patients. They may also work alongside orthopedic surgeons or neurosurgeons using their expertise in assisting surgical patients to recover after their surgeries.  INTERVENTIONAL PAIN MANAGEMENT is sub-subspecialty of Pain Management.  Our physicians are Board-certified in Anesthesia, Pain Management, and Interventional Pain Management.  This meaning that not only have they been trained and Board-certified in their specialty of Anesthesia, and subspecialty of Pain Management, but they have also received further training in the sub-subspecialty of Interventional Pain Management, in order to become Board-certified as INTERVENTIONAL PAIN MANAGEMENT SPECIALIST.    Mission: Our goal is to use our skills in  [place] as alternatives to the chronic use of prescription opioid medications for the treatment of pain. To make this more clear, we have changed our name to reflect what we do and offer. We will continue to offer medication management assessment and recommendations, but we will not be taking over any patient's medication  management.  ____________________________________________________________________________________________    ____________________________________________________________________________________________  Opioid Pain Medication Update  To: All patients taking opioid pain medications. (I.e.: hydrocodone, hydromorphone, oxycodone, oxymorphone, morphine, codeine, methadone, tapentadol, tramadol, buprenorphine, fentanyl, etc.)  Re: Review of side effects and adverse reactions of opioid analgesics, as well as new information about  long term effects of this class of medications.  Direct risks of long-term opioid therapy are not limited to opioid addiction and overdose. Potential medical risks include serious fractures, breathing problems during sleep, hyperalgesia, immunosuppression, chronic constipation, bowel obstruction, myocardial infarction, and tooth decay secondary to xerostomia.  Historically, the original case for using long-term opioid therapy to treat chronic noncancer pain was based on safety assumptions that subsequent experience has called into question. In 1996, the American Pain Society and the [place] [employer] issued a consensus statement supporting long-term opioid therapy. This statement acknowledged the dangers of opioid prescribing but concluded that the risk for addiction was low; respiratory depression induced by opioids was short-lived, occurred mainly in opioid-naive patients, and was antagonized by pain; tolerance was not a common problem; and efforts to control diversion should not constrain opioid prescribing. This has now proven to be wrong. Unfortunately, experience regarding the risks for opioid addiction, misuse, and overdose in community practice has failed to support these assumptions.  According to the Centers for Disease Control and Prevention, fatal overdoses involving opioid analgesics have increased sharply over the past decade. Currently, more than  96,700 people die from drug overdoses every year. Opioids are a factor in 7 out of every 10 overdose deaths. Deaths from drug overdose have surpassed motor vehicle accidents as the leading cause of death for individuals between the ages of 42 and 92.  Clinical data suggest that neuroendocrine dysfunction may be very common in both men and women, potentially causing hypogonadism, erectile dysfunction, infertility, decreased libido, osteoporosis, and depression. Recent studies linked higher opioid dose to increased opioid-related mortality. Controlled observational studies reported that long-term opioid therapy may be associated with increased risk for cardiovascular events. Subsequent meta-analysis concluded that the safety of long-term opioid therapy in elderly patients has not been proven.   Side Effects and adverse reactions: Common side effects: Drowsiness (sedation). Dizziness. Nausea and vomiting. Constipation. Physical dependence -- Dependence often manifests with withdrawal symptoms when opioids are discontinued or decreased. Tolerance -- As you take repeated doses of opioids, you require increased medication to experience the same effect of pain relief. Respiratory depression -- This can occur in healthy people, especially with higher doses. However, people with COPD, asthma or other lung conditions may be even more susceptible to fatal respiratory impairment.  Uncommon side effects: An increased sensitivity to feeling pain and extreme response to pain (hyperalgesia). Chronic use of opioids can lead to this. Delayed gastric emptying (the process by which the contents of your stomach are moved into your small intestine). Muscle rigidity. Immune system and hormonal dysfunction. Quick, involuntary muscle jerks (myoclonus). Arrhythmia. Itchy skin (pruritus). Dry mouth (xerostomia).  Long-term side effects: Chronic constipation. Sleep-disordered breathing (SDB). Increased risk of bone  fractures. Hypothalamic-pituitary-adrenal dysregulation. Increased risk of overdose.  RISKS: Fractures and Falls:  Opioids increase the risk and incidence of falls. This is of particular importance in elderly patients.  Endocrine System:  Long-term administration is associated with endocrine abnormalities. Influences on both the hypothalamic-pituitary-adrenal axis?and the hypothalamic-pituitary-gonadal axis have been demonstrated with consequent hypogonadism and adrenal insufficiency in both sexes. Hypogonadism and decreased levels of dehydroepiandrosterone sulfate have been reported in men and women. Endocrine effects can lead to: Amenorrhoea in women Reduced libido in both sexes Erectile dysfunction in men Infertility Depression and fatigue Patients (particularly women of childbearing age) should avoid opioids. There is insufficient evidence to recommend routine monitoring of asymptomatic patients taking opioids in the long-term for hormonal deficiencies.  Immune System: Human  studies have demonstrated that opioids have an immunomodulating effect. These effects are mediated via opioid receptors both on immune effector cells and in the central nervous system. Opioids have been demonstrated to have adverse effects on antimicrobial response and anti-tumour surveillance. Buprenorphine has been demonstrated to have no impact on immune function.  Opioid Induced Hyperalgesia: Human studies have demonstrated that prolonged use of opioids can lead to a state of abnormal pain sensitivity, sometimes called opioid induced hyperalgesia (OIH). Opioid induced hyperalgesia is not usually seen in the absence of tolerance to opioid analgesia. Clinically, hyperalgesia may be diagnosed if the patient on long-term opioid therapy presents with increased pain. This might be qualitatively and anatomically distinct from pain related to disease progression or to breakthrough pain resulting from development of  opioid tolerance. Pain associated with hyperalgesia tends to be more diffuse than the pre-existing pain and less defined in quality. Management of opioid induced hyperalgesia requires opioid dose reduction.  Cancer: Chronic opioid therapy has been associated with an increased risk of cancer among noncancer patients with chronic pain. This association was more evident in chronic strong opioid users. Chronic opioid consumption causes significant pathological changes in the small intestine and colon. Epidemiological studies have found that there is a link between opium dependence and initiation of gastrointestinal cancers. Cancer is the second leading cause of death after cardiovascular disease. Chronic use of opioids can cause multiple conditions such as GERD, immunosuppression and renal damage as well as carcinogenic effects, which are associated with the incidence of cancers.   Mortality: Long-term opioid use has been associated with increased mortality among patients with chronic non-cancer pain (CNCP).  Prescription of long-acting opioids for chronic noncancer pain was associated with a significantly increased risk of all-cause mortality, including deaths from causes other than overdose.  Reference: Von Korff M, Kolodny A, Deyo RA, Chou R. Long-term opioid therapy reconsidered. Ann Intern Med. 2011 Sep 6;155(5):325-8. doi: 10.7326/[id number]. PMID: 40347425; PMCID: [id number]. Morley Kos, Hayward RA, Dunn KM, [place] KP. Risk of adverse events in patients prescribed long-term opioids: A cohort study in the [place] [facility] [employer]. Eur J Pain. 2019 May;23(5):908-922. doi: 10.1002/ejp.1357. Epub 2019 Jan 31. PMID: 32951884. Colameco S, Coren JS, Ciervo CA. Continuous opioid treatment for chronic noncancer pain: a time for moderation in prescribing. Postgrad Med. 2009 Jul;121(4):61-6. doi: 10.3810/pgm.2009.07.2032. PMID: 16606301. Heywood Bene RN, Heuvelton SD, Blazina I, Rosalio Loud, Bougatsos C, Deyo RA. The effectiveness and risks of long-term opioid therapy for chronic pain: a systematic review for a [employer] of [employer]ways to [employer]. Ann Intern Med. 2015 Feb 17;162(4):276-86. doi: 60.1093/A35-5732. PMID: 20254270. Marjory Sneddon [facility], Makuc DM. NCHS Data Brief No. 22. [place]: Centers for Disease Control and Prevention; 2009. Sep, Increase in Fatal Poisonings Involving Opioid Analgesics in the [place], 1999-2006. Song IA, Choi HR, Oh TK. Long-term opioid use and mortality in patients with chronic non-cancer pain: Ten-year follow-up study in [place] from 2010 through 2019. EClinicalMedicine. 2022 Jul 18;51:101558. doi: 10.1016/j.eclinm.2022.623762. PMID: 83151761; PMCID: [id number]. Huser, W., Schubert, T., Vogelmann, T. et al. All-cause mortality in patients with long-term opioid therapy compared with non-opioid analgesics for chronic non-cancer pain: a database study. [place] Med 18, 162 (2020). [email or web address] Rashidian H, Roxy Cedar, Malekzadeh R, Haghdoost AA. An Ecological Study of the Association between Opiate Use and Incidence of Cancers. Addict Health. 2016 Fall;8(4):252-260. PMID: 94854627; PMCID: [id number].  Our Goals and Recommendations: Our goal is to control  your pain with means other than the use of opioid pain medications. Talk to your physician about coming off of these medications. We can assist you with the tapering down and stopping these medicines. Based on the information above, even if you cannot completely stop these medicines, even a decrease in the dose has been shown to be associated with a decreased risk.  ____________________________________________________________________________________________

## 2022-06-08 ENCOUNTER — Encounter: Payer: Self-pay | Admitting: Pain Medicine

## 2022-06-08 ENCOUNTER — Ambulatory Visit: Payer: 59 | Attending: Pain Medicine | Admitting: Pain Medicine

## 2022-06-08 VITALS — BP 129/87 | HR 83 | Temp 97.3°F | Resp 14 | Ht 63.0 in | Wt 120.0 lb

## 2022-06-08 DIAGNOSIS — M19012 Primary osteoarthritis, left shoulder: Secondary | ICD-10-CM | POA: Diagnosis not present

## 2022-06-08 DIAGNOSIS — Z79899 Other long term (current) drug therapy: Secondary | ICD-10-CM

## 2022-06-08 DIAGNOSIS — G894 Chronic pain syndrome: Secondary | ICD-10-CM | POA: Diagnosis not present

## 2022-06-08 DIAGNOSIS — M545 Low back pain, unspecified: Secondary | ICD-10-CM | POA: Insufficient documentation

## 2022-06-08 DIAGNOSIS — G8929 Other chronic pain: Secondary | ICD-10-CM | POA: Insufficient documentation

## 2022-06-08 DIAGNOSIS — M79604 Pain in right leg: Secondary | ICD-10-CM | POA: Insufficient documentation

## 2022-06-08 DIAGNOSIS — M25512 Pain in left shoulder: Secondary | ICD-10-CM | POA: Diagnosis present

## 2022-06-08 DIAGNOSIS — Z79891 Long term (current) use of opiate analgesic: Secondary | ICD-10-CM | POA: Diagnosis present

## 2022-06-08 DIAGNOSIS — M25562 Pain in left knee: Secondary | ICD-10-CM | POA: Diagnosis present

## 2022-12-21 ENCOUNTER — Other Ambulatory Visit: Payer: Self-pay | Admitting: Orthopedic Surgery

## 2023-02-13 ENCOUNTER — Other Ambulatory Visit (HOSPITAL_COMMUNITY): Payer: Self-pay | Admitting: Emergency Medicine

## 2023-02-13 DIAGNOSIS — Z1231 Encounter for screening mammogram for malignant neoplasm of breast: Secondary | ICD-10-CM

## 2023-03-10 ENCOUNTER — Ambulatory Visit (HOSPITAL_COMMUNITY)
Admission: RE | Admit: 2023-03-10 | Discharge: 2023-03-10 | Disposition: A | Discharge status: Home / Self Care | Payer: 59 | Source: Ambulatory Visit | Attending: Emergency Medicine | Admitting: Emergency Medicine

## 2023-03-10 DIAGNOSIS — Z1231 Encounter for screening mammogram for malignant neoplasm of breast: Secondary | ICD-10-CM | POA: Insufficient documentation

## 2023-06-14 ENCOUNTER — Other Ambulatory Visit: Payer: Self-pay | Admitting: Orthopedic Surgery

## 2023-06-20 DIAGNOSIS — G894 Chronic pain syndrome: Secondary | ICD-10-CM | POA: Diagnosis not present

## 2023-06-20 DIAGNOSIS — M25511 Pain in right shoulder: Secondary | ICD-10-CM | POA: Diagnosis not present

## 2023-06-20 DIAGNOSIS — M545 Low back pain, unspecified: Secondary | ICD-10-CM | POA: Diagnosis not present

## 2023-06-20 DIAGNOSIS — M25512 Pain in left shoulder: Secondary | ICD-10-CM | POA: Diagnosis not present

## 2023-06-20 DIAGNOSIS — G589 Mononeuropathy, unspecified: Secondary | ICD-10-CM | POA: Diagnosis not present

## 2023-06-20 DIAGNOSIS — G5601 Carpal tunnel syndrome, right upper limb: Secondary | ICD-10-CM | POA: Diagnosis not present

## 2023-06-20 DIAGNOSIS — Z79899 Other long term (current) drug therapy: Secondary | ICD-10-CM | POA: Diagnosis not present

## 2023-06-20 DIAGNOSIS — M25562 Pain in left knee: Secondary | ICD-10-CM | POA: Diagnosis not present

## 2023-06-20 DIAGNOSIS — Z79891 Long term (current) use of opiate analgesic: Secondary | ICD-10-CM | POA: Diagnosis not present

## 2023-06-22 DIAGNOSIS — K219 Gastro-esophageal reflux disease without esophagitis: Secondary | ICD-10-CM | POA: Diagnosis not present

## 2023-06-22 DIAGNOSIS — E785 Hyperlipidemia, unspecified: Secondary | ICD-10-CM | POA: Diagnosis not present

## 2023-06-22 DIAGNOSIS — J449 Chronic obstructive pulmonary disease, unspecified: Secondary | ICD-10-CM | POA: Diagnosis not present

## 2023-08-15 DIAGNOSIS — Z79899 Other long term (current) drug therapy: Secondary | ICD-10-CM | POA: Diagnosis not present

## 2023-08-15 DIAGNOSIS — G894 Chronic pain syndrome: Secondary | ICD-10-CM | POA: Diagnosis not present

## 2023-08-15 DIAGNOSIS — M25512 Pain in left shoulder: Secondary | ICD-10-CM | POA: Diagnosis not present

## 2023-08-15 DIAGNOSIS — M25562 Pain in left knee: Secondary | ICD-10-CM | POA: Diagnosis not present

## 2023-08-15 DIAGNOSIS — Z79891 Long term (current) use of opiate analgesic: Secondary | ICD-10-CM | POA: Diagnosis not present

## 2023-08-15 DIAGNOSIS — G589 Mononeuropathy, unspecified: Secondary | ICD-10-CM | POA: Diagnosis not present

## 2023-08-15 DIAGNOSIS — G5601 Carpal tunnel syndrome, right upper limb: Secondary | ICD-10-CM | POA: Diagnosis not present

## 2023-08-15 DIAGNOSIS — M25511 Pain in right shoulder: Secondary | ICD-10-CM | POA: Diagnosis not present

## 2023-08-15 DIAGNOSIS — M545 Low back pain, unspecified: Secondary | ICD-10-CM | POA: Diagnosis not present

## 2023-08-23 DIAGNOSIS — Z79899 Other long term (current) drug therapy: Secondary | ICD-10-CM | POA: Diagnosis not present

## 2023-10-10 DIAGNOSIS — M25511 Pain in right shoulder: Secondary | ICD-10-CM | POA: Diagnosis not present

## 2023-10-10 DIAGNOSIS — M25512 Pain in left shoulder: Secondary | ICD-10-CM | POA: Diagnosis not present

## 2023-10-10 DIAGNOSIS — G589 Mononeuropathy, unspecified: Secondary | ICD-10-CM | POA: Diagnosis not present

## 2023-10-10 DIAGNOSIS — Z79899 Other long term (current) drug therapy: Secondary | ICD-10-CM | POA: Diagnosis not present

## 2023-10-10 DIAGNOSIS — G894 Chronic pain syndrome: Secondary | ICD-10-CM | POA: Diagnosis not present

## 2023-10-10 DIAGNOSIS — Z79891 Long term (current) use of opiate analgesic: Secondary | ICD-10-CM | POA: Diagnosis not present

## 2023-10-10 DIAGNOSIS — M25562 Pain in left knee: Secondary | ICD-10-CM | POA: Diagnosis not present

## 2023-10-10 DIAGNOSIS — M545 Low back pain, unspecified: Secondary | ICD-10-CM | POA: Diagnosis not present

## 2023-10-10 DIAGNOSIS — G5601 Carpal tunnel syndrome, right upper limb: Secondary | ICD-10-CM | POA: Diagnosis not present

## 2023-11-15 DIAGNOSIS — Z79899 Other long term (current) drug therapy: Secondary | ICD-10-CM | POA: Diagnosis not present

## 2023-12-18 DIAGNOSIS — N1831 Chronic kidney disease, stage 3a: Secondary | ICD-10-CM | POA: Diagnosis not present

## 2023-12-18 DIAGNOSIS — Z79899 Other long term (current) drug therapy: Secondary | ICD-10-CM | POA: Diagnosis not present

## 2023-12-25 DIAGNOSIS — G8929 Other chronic pain: Secondary | ICD-10-CM | POA: Diagnosis not present

## 2023-12-25 DIAGNOSIS — E782 Mixed hyperlipidemia: Secondary | ICD-10-CM | POA: Diagnosis not present

## 2024-02-07 DIAGNOSIS — Z79899 Other long term (current) drug therapy: Secondary | ICD-10-CM | POA: Diagnosis not present

## 2024-02-26 ENCOUNTER — Other Ambulatory Visit (HOSPITAL_COMMUNITY): Payer: Self-pay | Admitting: Emergency Medicine

## 2024-02-26 DIAGNOSIS — Z1231 Encounter for screening mammogram for malignant neoplasm of breast: Secondary | ICD-10-CM

## 2024-03-11 ENCOUNTER — Encounter (HOSPITAL_COMMUNITY): Payer: Self-pay

## 2024-03-11 ENCOUNTER — Ambulatory Visit (HOSPITAL_COMMUNITY)
Admission: RE | Admit: 2024-03-11 | Discharge: 2024-03-11 | Disposition: A | Discharge status: Home / Self Care | Source: Ambulatory Visit | Attending: Emergency Medicine | Admitting: Emergency Medicine

## 2024-03-11 DIAGNOSIS — Z1231 Encounter for screening mammogram for malignant neoplasm of breast: Secondary | ICD-10-CM | POA: Diagnosis present

## 2024-06-21 ENCOUNTER — Other Ambulatory Visit: Payer: Self-pay | Admitting: Orthopedic Surgery
